# Patient Record
Sex: Female | Born: 1985
Health system: Southern US, Community
[De-identification: ages and names within clinical notes are randomized; demographics above are authoritative.]

## PROBLEM LIST (undated history)

## (undated) DIAGNOSIS — I499 Cardiac arrhythmia, unspecified: Secondary | ICD-10-CM

## (undated) DIAGNOSIS — I83891 Varicose veins of right lower extremities with other complications: Secondary | ICD-10-CM

## (undated) HISTORY — DX: Varicose veins of right lower extremity with other complications: I83.891

## (undated) HISTORY — DX: Cardiac arrhythmia, unspecified: I49.9

## (undated) HISTORY — PX: HAND SURGERY: SHX662

---
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
## 2013-01-12 ENCOUNTER — Encounter: Payer: Self-pay | Admitting: Vascular Surgery

## 2013-01-13 ENCOUNTER — Ambulatory Visit (INDEPENDENT_AMBULATORY_CARE_PROVIDER_SITE_OTHER): Payer: PRIVATE HEALTH INSURANCE | Admitting: Vascular Surgery

## 2013-01-13 ENCOUNTER — Encounter: Payer: Self-pay | Admitting: Vascular Surgery

## 2013-01-13 ENCOUNTER — Encounter (INDEPENDENT_AMBULATORY_CARE_PROVIDER_SITE_OTHER): Payer: PRIVATE HEALTH INSURANCE | Admitting: Vascular Surgery

## 2013-01-13 VITALS — BP 93/59 | HR 71 | Resp 16 | Ht 63.5 in | Wt 110.0 lb

## 2013-01-13 DIAGNOSIS — M7989 Other specified soft tissue disorders: Secondary | ICD-10-CM

## 2013-01-13 DIAGNOSIS — I83893 Varicose veins of bilateral lower extremities with other complications: Secondary | ICD-10-CM

## 2013-01-13 DIAGNOSIS — I83891 Varicose veins of right lower extremities with other complications: Secondary | ICD-10-CM | POA: Insufficient documentation

## 2013-01-13 DIAGNOSIS — M79609 Pain in unspecified limb: Secondary | ICD-10-CM

## 2013-01-13 NOTE — Assessment & Plan Note (Signed)
This patient has a cluster of varicose veins along the medial aspect of her right lower extremity. This is likely fed by an incompetent segment of the greater saphenous vein around the knee. The saphenous vein in this area that was quite small and therefore she is not a good candidate at this point for laser ablation of this segment of her greater saphenous vein. Her symptoms only occur generally when she is running. We have discussed the use of compression stockings when she is running and I have written her a prescription for knee-high stockings with a gradient of 20-30 mm of mercury. I have also recommended that she elevate her legs after running. If her symptoms progressed and she could be considered for surgical excision of the incompetent segment of her greater saphenous vein with stab avulsion of her large truncal varicosities in this area. If her symptoms worsen she knows to call us and we'll be happy to see her back at any time.

## 2013-01-13 NOTE — Progress Notes (Signed)
[facility]  Patient name: Deborah Stafford MRN: [id number] DOB: 13-Dec-1985 Sex: female  REASON FOR CONSULT: painful varicose veins of the right lower extremity.  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 Since that time she developed varicose veins in the medial aspect of her right leg. He said gradually progressed. She really does not experience any significant symptoms in her leg with standing and sitting. However, when she runs she does develop significant aching pain and swelling in the right leg. This is relieved with rest. She is unaware of any previous history of DVT or phlebitis. There is no family history of varicose veins that she is aware of.  Past Medical History  Diagnosis Date  . Irregular heartbeat     Family History  Problem Relation Age of Onset  . Hypertension Mother   . Hyperlipidemia Father     under control  . Cancer Maternal Grandfather     Lung  . Diabetes Maternal Grandfather     Type II  . Cancer Paternal Grandfather     Lung   SOCIAL HISTORY: History  Substance Use Topics  . Smoking status: Never Smoker   . Smokeless tobacco: Never Used  . Alcohol Use: 0.6 oz/week    1 Cans of beer per week   Current Outpatient Prescriptions  Medication Sig Dispense Refill  . loratadine (CLARITIN) 10 MG tablet Take 10 mg by mouth continuous as needed for allergies.      . Multiple Vitamin (MULTIVITAMIN) tablet Take 1 tablet by mouth daily.       No current facility-administered medications for this visit.   REVIEW OF SYSTEMS: Arly.Keller ] denotes positive finding; [  ] denotes negative finding  CARDIOVASCULAR:  [ ]  chest pain   [ ]  chest pressure   [ ]  palpitations   [ ]  orthopnea   [ ]  dyspnea on exertion   [ ]  claudication   [ ]  rest pain   [ ]  DVT   [ ]  phlebitis PULMONARY:   [ ]  productive cough   [ ]  asthma   [ ]  wheezing NEUROLOGIC:   [ ]  weakness  [ ]  paresthesias  [ ]  aphasia  [ ]  amaurosis  [ ]   dizziness HEMATOLOGIC:   [ ]  bleeding problems   [ ]  clotting disorders MUSCULOSKELETAL:  [ ]  joint pain   [ ]  joint swelling [ ]  leg swelling GASTROINTESTINAL: [ ]   blood in stool  [ ]   hematemesis GENITOURINARY:  [ ]   dysuria  [ ]   hematuria PSYCHIATRIC:  [ ]  history of major depression INTEGUMENTARY:  [ ]  rashes  [ ]  ulcers CONSTITUTIONAL:  [ ]  fever   [ ]  chills  PHYSICAL EXAM: Filed Vitals:   01/13/13 1425  BP: 93/59  Pulse: 71  Resp: 16  Height: 5' 3.5" (1.613 m)  Weight: 110 lb (49.896 kg)  SpO2: 98%   Body mass index is 19.18 kg/(m^2). GENERAL: The patient is a well-nourished female, in no acute distress. The vital signs are documented above. CARDIOVASCULAR: There is a regular rate and rhythm. I do not detect carotid bruits. She has palpable pedal pulses. PULMONARY: There is good air exchange bilaterally without wheezing or rales. ABDOMEN: Soft and non-tender with normal pitched bowel sounds.  MUSCULOSKELETAL: There are no major deformities or cyanosis. NEUROLOGIC: No focal weakness or paresthesias are detected. SKIN: she has some large truncal varicosities along the medial aspect of her right  leg which are under elevated pressure. There is no hyperpigmentation. PSYCHIATRIC: The patient has a normal affect.  DATA:  I have independently interpreted her duplex scan of her right lower extremity which shows no evidence of DVT or deep vein reflux. She does have some reflux in the greater saphenous vein around the knee. Diameters here range from 0.32 to 0.39 cm.  MEDICAL ISSUES:  Varicose veins of lower extremities with other complications This patient has a cluster of varicose veins along the medial aspect of her right lower extremity. This is likely fed by an incompetent segment of the greater saphenous vein around the knee. The saphenous vein in this area that was quite small and therefore she is not a good candidate at this point for laser ablation of this segment of her  greater saphenous vein. Her symptoms only occur generally when she is running. We have discussed the use of compression stockings when she is running and I have written her a prescription for knee-high stockings with a gradient of 20-30 mm of mercury. I have also recommended that she elevate her legs after running. If her symptoms progressed and she could be considered for surgical excision of the incompetent segment of her greater saphenous vein with stab avulsion of her large truncal varicosities in this area. If her symptoms worsen she knows to call us and we'll be happy to see her back at any time.   Griffin Gerrard S [facility] [place] [facility] [place]: [phone]

## 2016-04-19 DIAGNOSIS — R41840 Attention and concentration deficit: Secondary | ICD-10-CM | POA: Diagnosis not present

## 2016-04-22 MED FILL — DEXTROAMP-AMP 7.5 MG TABLET: 7.5 | 30 days supply | Qty: 90 | Fill #0

## 2016-05-14 DIAGNOSIS — R41840 Attention and concentration deficit: Secondary | ICD-10-CM | POA: Diagnosis not present

## 2016-05-22 MED FILL — DEXTROAMP-AMPHETAM 7.5 MG T: 7.5 | 30 days supply | Qty: 90 | Fill #0

## 2016-06-20 MED FILL — DEXTROAMP-AMPHETAM 7.5 MG T: 7.5 | 30 days supply | Qty: 90 | Fill #0

## 2016-07-17 MED FILL — DEXTROAMP-AMPHETAM 7.5 MG T: 7.5 | 30 days supply | Qty: 90 | Fill #0

## 2016-07-25 DIAGNOSIS — N921 Excessive and frequent menstruation with irregular cycle: Secondary | ICD-10-CM | POA: Diagnosis not present

## 2016-07-25 DIAGNOSIS — Z681 Body mass index (BMI) 19 or less, adult: Secondary | ICD-10-CM | POA: Diagnosis not present

## 2016-07-25 DIAGNOSIS — Z0000 Encounter for general adult medical examination without abnormal findings: Secondary | ICD-10-CM | POA: Diagnosis not present

## 2016-07-25 DIAGNOSIS — R41840 Attention and concentration deficit: Secondary | ICD-10-CM | POA: Diagnosis not present

## 2016-07-25 DIAGNOSIS — J309 Allergic rhinitis, unspecified: Secondary | ICD-10-CM | POA: Diagnosis not present

## 2016-08-01 DIAGNOSIS — Z23 Encounter for immunization: Secondary | ICD-10-CM | POA: Diagnosis not present

## 2016-08-01 DIAGNOSIS — Z0000 Encounter for general adult medical examination without abnormal findings: Secondary | ICD-10-CM | POA: Diagnosis not present

## 2016-08-14 MED FILL — DEXTROAMP-AMPHETAM 7.5 MG T: 7.5 | 90 days supply | Qty: 270 | Fill #0

## 2016-08-27 MED FILL — LARIN 21 1-20 TABLET: 1-20 | 84 days supply | Qty: 63 | Fill #0

## 2016-11-11 MED FILL — LARIN 21 1-20 TABLET: 1-20 | 84 days supply | Qty: 63 | Fill #1

## 2016-11-12 DIAGNOSIS — J309 Allergic rhinitis, unspecified: Secondary | ICD-10-CM | POA: Diagnosis not present

## 2016-11-12 DIAGNOSIS — R41840 Attention and concentration deficit: Secondary | ICD-10-CM | POA: Diagnosis not present

## 2016-11-12 DIAGNOSIS — Z6821 Body mass index (BMI) 21.0-21.9, adult: Secondary | ICD-10-CM | POA: Diagnosis not present

## 2016-11-12 MED FILL — MONTELUKAST SOD 10 MG TAB: 10 | 90 days supply | Qty: 90 | Fill #0

## 2016-11-12 MED FILL — DEXTROAMP-AMPHETAM 7.5 MG T: 7.5 | 90 days supply | Qty: 270 | Fill #0

## 2017-01-23 MED FILL — LARIN 21 1-20 TABLET: 1-20 | 84 days supply | Qty: 63 | Fill #2

## 2017-02-11 DIAGNOSIS — J309 Allergic rhinitis, unspecified: Secondary | ICD-10-CM | POA: Diagnosis not present

## 2017-02-11 DIAGNOSIS — N921 Excessive and frequent menstruation with irregular cycle: Secondary | ICD-10-CM | POA: Diagnosis not present

## 2017-02-11 DIAGNOSIS — Z6821 Body mass index (BMI) 21.0-21.9, adult: Secondary | ICD-10-CM | POA: Diagnosis not present

## 2017-02-11 DIAGNOSIS — R41840 Attention and concentration deficit: Secondary | ICD-10-CM | POA: Diagnosis not present

## 2017-02-11 MED FILL — AMPHETAMINE SALTS 15 MG TAB: 15 | 90 days supply | Qty: 180 | Fill #0

## 2017-04-11 MED FILL — LARIN 21 1-20 TABLET: 1-20 | 78 days supply | Qty: 63 | Fill #0

## 2017-05-13 DIAGNOSIS — Z6822 Body mass index (BMI) 22.0-22.9, adult: Secondary | ICD-10-CM | POA: Diagnosis not present

## 2017-05-13 DIAGNOSIS — R41840 Attention and concentration deficit: Secondary | ICD-10-CM | POA: Diagnosis not present

## 2017-05-13 MED FILL — AMPHETAMINE SALTS 15 MG TAB: 15 | 90 days supply | Qty: 180 | Fill #0

## 2017-07-04 MED FILL — LARIN 21 1-20 TABLET: 1-20 | 78 days supply | Qty: 63 | Fill #1

## 2017-08-01 DIAGNOSIS — Z0000 Encounter for general adult medical examination without abnormal findings: Secondary | ICD-10-CM | POA: Diagnosis not present

## 2017-08-06 DIAGNOSIS — Z6822 Body mass index (BMI) 22.0-22.9, adult: Secondary | ICD-10-CM | POA: Diagnosis not present

## 2017-08-06 DIAGNOSIS — R41840 Attention and concentration deficit: Secondary | ICD-10-CM | POA: Diagnosis not present

## 2017-08-06 DIAGNOSIS — L509 Urticaria, unspecified: Secondary | ICD-10-CM | POA: Diagnosis not present

## 2017-08-06 DIAGNOSIS — Z01419 Encounter for gynecological examination (general) (routine) without abnormal findings: Secondary | ICD-10-CM | POA: Diagnosis not present

## 2017-08-06 DIAGNOSIS — Z118 Encounter for screening for other infectious and parasitic diseases: Secondary | ICD-10-CM | POA: Diagnosis not present

## 2017-08-06 DIAGNOSIS — L812 Freckles: Secondary | ICD-10-CM | POA: Diagnosis not present

## 2017-08-06 DIAGNOSIS — N760 Acute vaginitis: Secondary | ICD-10-CM | POA: Diagnosis not present

## 2017-08-06 DIAGNOSIS — Z0000 Encounter for general adult medical examination without abnormal findings: Secondary | ICD-10-CM | POA: Diagnosis not present

## 2017-08-06 DIAGNOSIS — I83811 Varicose veins of right lower extremities with pain: Secondary | ICD-10-CM | POA: Diagnosis not present

## 2017-08-11 MED FILL — DEXTROAMP-AMPHETAMIN 15 MG: 15 | 90 days supply | Qty: 180 | Fill #0

## 2017-09-09 ENCOUNTER — Encounter: Payer: PRIVATE HEALTH INSURANCE | Admitting: Vascular Surgery

## 2017-09-22 ENCOUNTER — Ambulatory Visit (INDEPENDENT_AMBULATORY_CARE_PROVIDER_SITE_OTHER): Payer: PRIVATE HEALTH INSURANCE | Admitting: Vascular Surgery

## 2017-09-22 ENCOUNTER — Encounter: Payer: Self-pay | Admitting: Vascular Surgery

## 2017-09-22 VITALS — BP 125/81 | HR 96 | Temp 97.1°F | Resp 18 | Ht 64.0 in | Wt 125.6 lb

## 2017-09-22 DIAGNOSIS — I83891 Varicose veins of right lower extremities with other complications: Secondary | ICD-10-CM

## 2017-09-22 DIAGNOSIS — I868 Varicose veins of other specified sites: Secondary | ICD-10-CM

## 2017-09-22 NOTE — Progress Notes (Signed)
Subjective:     Patient ID: Deborah Stafford, female   DOB: 09/15/1985, 32 y.o.   MRN: [id number]  HPI This 32 year old female returns for further evaluation of her painful varicosities in the right leg. She was seen by Dr. Cari Carawayhris Dickson in 2014 for bulging varicosities in the right lower leg emanating from the great saphenous vein. At that time she did not have severe symptoms other than running and she has had conservative treatment since that time. She now has had increasing symptoms and increasing Lee prominent bulges in the right lower leg. She had some bulging varicosities in high school and has received some trauma to this area on a couple of occasions which have resulted in worsening of the symptoms and the prominence of varicosities. She has no history of DVT thrombophlebitis stasis ulcers or bleeding. She does develop some swelling as the day progresses. She tried long-leg elastic compression stockings on occasion which do not help her symptoms. This is affecting her daily living.  Past Medical History:  Diagnosis Date  . Irregular heartbeat     Social History   Tobacco Use  . Smoking status: Never Smoker  . Smokeless tobacco: Never Used  Substance Use Topics  . Alcohol use: Yes    Alcohol/week: 0.6 oz    Types: 1 Cans of beer per week    Family History  Problem Relation Age of Onset  . Hypertension Mother   . Hyperlipidemia Father        under control  . Cancer Maternal Grandfather        Lung  . Diabetes Maternal Grandfather        Type II  . Cancer Paternal Grandfather        Lung    No Known Allergies   Current Outpatient Medications:  .  amphetamine-dextroamphetamine (ADDERALL) 15 MG tablet, Take 15 mg by mouth 2 (two) times daily., Disp: , Rfl:  .  loratadine (CLARITIN) 10 MG tablet, Take 10 mg by mouth continuous as needed for allergies., Disp: , Rfl:  .  Multiple Vitamin (MULTIVITAMIN) tablet, Take 1 tablet by mouth daily., Disp: , Rfl:  .   norethindrone-ethinyl estradiol (MICROGESTIN,JUNEL,LOESTRIN) 1-20 MG-MCG tablet, Take 1 tablet by mouth daily., Disp: , Rfl:   Vitals:   09/22/17 0859  BP: 125/81  Pulse: 96  Resp: 18  Temp: (!) 97.1 F (36.2 C)  TempSrc: Oral  SpO2: 100%  Weight: 125 lb 9.6 oz (57 kg)  Height: 5\' 4"  (1.626 m)    Body mass index is 21.56 kg/m.         Review of Systems Denies chest pain, dyspnea on exertion, PND, orthopnea, hemoptysis, claudication    Objective:   Physical Exam BP 125/81 (BP Location: Left Arm, Patient Position: Sitting, Cuff Size: Normal)   Pulse 96   Temp (!) 97.1 F (36.2 C) (Oral)   Resp 18   Ht 5\' 4"  (1.626 m)   Wt 125 lb 9.6 oz (57 kg)   SpO2 100%   BMI 21.56 kg/m     Gen.-alert and oriented x3 in no apparent distress HEENT normal for age Lungs no rhonchi or wheezing Cardiovascular regular rhythm no murmurs carotid pulses 3+ palpable no bruits audible Abdomen soft nontender no palpable masses Musculoskeletal free of  major deformities Skin clear -no rashes Neurologic normal Lower extremities 3+ femoral and dorsalis pedis pulses palpable bilaterally with no edema Right leg with large prominent varicosities beginning just below the knee off the great saphenous  system extending into the pretibial area anterolaterally and down towards the ankle. She has no hyperpigmentation or ulceration noted. No varicosities and contralateral left leg.  Today I reviewed the previous ultrasound performed in 2014 and also performed a bedside SonoSite ultrasound exam. With the patient standing she has a fairly large right great saphenous vein in the distal thigh extending below the knee supplying these painful varicosities and there is reflux. Small saphenous vein appears normal       Assessment:     Painful varicosities right leg with gross reflux in the distal great saphenous vein with enlargement supplying these painful varicosities. The symptoms are affecting her daily  living.    Plan:         #1 long leg elastic compression stockings 20-30 mm gradient #2 elevate legs as much as possible #3 ibuprofen daily on a regular basis for pain #4 return in 3 months-she will have formal venous reflux exam of the right leg upon return in all like formal recommendation at that time. She should have these treated since they are becoming increasingly prominent and symptomatic.

## 2017-09-23 NOTE — Addendum Note (Signed)
Addended by: Burton ApleyPETTY, Yuleni Burich A on: 09/23/2017 03:15 PM   Modules accepted: Orders

## 2017-09-26 MED FILL — LARIN 21 1-20 TABLET: 1-20 | 78 days supply | Qty: 63 | Fill #2

## 2017-10-27 ENCOUNTER — Encounter: Payer: PRIVATE HEALTH INSURANCE | Admitting: Vascular Surgery

## 2017-11-05 DIAGNOSIS — Z6821 Body mass index (BMI) 21.0-21.9, adult: Secondary | ICD-10-CM | POA: Diagnosis not present

## 2017-11-05 DIAGNOSIS — R41840 Attention and concentration deficit: Secondary | ICD-10-CM | POA: Diagnosis not present

## 2017-11-05 DIAGNOSIS — Z3041 Encounter for surveillance of contraceptive pills: Secondary | ICD-10-CM | POA: Diagnosis not present

## 2017-11-05 DIAGNOSIS — I83811 Varicose veins of right lower extremities with pain: Secondary | ICD-10-CM | POA: Diagnosis not present

## 2017-11-07 MED FILL — DEXTROAMP-AMPHETAMIN 15 MG: 15 | 90 days supply | Qty: 180 | Fill #0

## 2017-12-11 ENCOUNTER — Telehealth: Payer: 59 | Admitting: Family

## 2017-12-11 DIAGNOSIS — R21 Rash and other nonspecific skin eruption: Secondary | ICD-10-CM | POA: Diagnosis not present

## 2017-12-11 MED ORDER — CLINDAMYCIN PHOS-BENZOYL PEROX 1-5 % EX GEL: Freq: Two times a day (BID) | CUTANEOUS | 0 refills | Status: DC

## 2017-12-11 MED ORDER — PREDNISONE 5 MG PO TABS: 5.0000 mg | ORAL_TABLET | ORAL | 0 refills | Status: DC

## 2017-12-11 MED FILL — predniSONE 5 MG TABS: 5 | 6 days supply | Qty: 21 | Fill #0

## 2017-12-11 MED FILL — CLINDAMYCIN PHOS-BENZOYL PE: 1-5 | 30 days supply | Qty: 25 | Fill #0

## 2017-12-11 NOTE — Progress Notes (Signed)
Thank you for the details you included in the comment boxes. Those details are very helpful in determining the best course of treatment for you and help [place] to provide the best care. Honestly, this does not look like scabies. However, it is very difficult to tell what it is because there is no bite pattern with certain bugs that we know (like bed bugs, etc.). The spots are scattered without a specific pattern. It is likely acne (which can be a reaction to many things internally coming out), or an allergic reaction. If it is an allergic reaction, we treat it with prednisone. However, if it is actually an acne breakout, the prednisone can make it much worse. Here is the ideal plan: 1 I am sending benzyl peroxide gel with antibiotics to see if it improves in 24-48 hours. If it does not, take the prednisone pack I am sending you.    We are sorry that you are experiencing this issue.  Here is how we plan to help!  Based on what you shared with me it looks like you have uncomplicated acne.  Acne is a disorder of the hair follicles and oil glands (sebaceous glands). The sebaceous glands secrete oils to keep the skin moist.  When the glands get clogged, it can lead to pimples or cysts.  These cysts may become infected and leave scars. Acne is very common and normally occurs at puberty.  Acne is also inherited.  Your personal care plan consists of the following recommendations:  I recommend that you use a daily cleanser  You might try an over the counter cleanser that has benzoyl peroxide.  I recommend that you start with a product that has 2.5% benzoyl peroxide.  Stronger concentrations have not been shown to be more effective.  I have prescribed a topical gel with an antibiotic:  Clindamycin-benzoyl peroxide gel.  This gel should be applied to the affected areas twice a day.  Be sure to read the package insert to understand potential side effects.  I have also prescribed one of the following additional  therapies:    If excessive dryness or peeling occurs, reduce dose frequency or concentration of the topical scrubs.  If excessive stinging or burning occurs, remove the topical gel with mild soap and water and resume at a lower dose the next day.  Remember oral antibiotics and topical acne treatments may increase your sensitivity to the sun!  HOME CARE:  Do not squeeze pimples because that can often lead to infections, worse acne, and scars.  Use a moisturizer that contains retinoid or fruit acids that may inhibit the development of new acne lesions.  Although there is not a clear link that foods can cause acne, doctors do believe that too many sweets predispose you to skin problems.  GET HELP RIGHT AWAY IF:  If your acne gets worse or is not better within 10 days.  If you become depressed.  If you become pregnant, discontinue medications and call your OB/GYN.  MAKE SURE YOU:  Understand these instructions.  Will watch your condition.  Will get help right away if you are not doing well or get worse.   Your e-visit answers were reviewed by a board certified advanced clinical practitioner to complete your personal care plan.  Depending upon the condition, your plan could have included both over the counter or prescription medications.  Please review your pharmacy choice.  If there is a problem, you may contact your provider through [employer] and have  the prescription routed to another pharmacy.  Your safety is important to us.  If you have drug allergies check your prescription carefully.  For the next 24 hours you can use MyChart to ask questions about today's visit, request a non-urgent call back, or ask for a work or school excuse from your e-visit provider.  You will get an email in the next two days asking about your experience. I hope that your e-visit has been valuable and will speed your recovery.

## 2017-12-15 MED FILL — LARIN 21 1-20 TABLET: 1-20 | 78 days supply | Qty: 63 | Fill #3

## 2017-12-23 ENCOUNTER — Ambulatory Visit (HOSPITAL_COMMUNITY)
Admission: RE | Admit: 2017-12-23 | Discharge: 2017-12-23 | Disposition: A | Discharge status: Home / Self Care | Payer: 59 | Source: Ambulatory Visit | Attending: Vascular Surgery | Admitting: Vascular Surgery

## 2017-12-23 ENCOUNTER — Encounter: Payer: Self-pay | Admitting: Vascular Surgery

## 2017-12-23 ENCOUNTER — Ambulatory Visit (INDEPENDENT_AMBULATORY_CARE_PROVIDER_SITE_OTHER): Payer: 59 | Admitting: Vascular Surgery

## 2017-12-23 VITALS — BP 104/67 | HR 86 | Temp 97.8°F | Resp 18 | Ht 63.0 in | Wt 124.8 lb

## 2017-12-23 DIAGNOSIS — R936 Abnormal findings on diagnostic imaging of limbs: Secondary | ICD-10-CM | POA: Diagnosis not present

## 2017-12-23 DIAGNOSIS — I83891 Varicose veins of right lower extremities with other complications: Secondary | ICD-10-CM | POA: Insufficient documentation

## 2017-12-23 DIAGNOSIS — I872 Venous insufficiency (chronic) (peripheral): Secondary | ICD-10-CM | POA: Diagnosis not present

## 2017-12-23 NOTE — Progress Notes (Signed)
Subjective:     Patient ID: Deborah Stafford, female   DOB: 03-17-1986, 32 y.o.   MRN: [id number]  HPI This 32 year old female returns for further follow-up regarding her painful varicosities in the right calf.  She has tried long-leg elastic compression stockings 20-30 mm gradient as well as elevation and ibuprofen but continues to have a heavy achy throb E feeling which worsens as the day progresses.  She will also develop some slight swelling in the right ankle.  She has no history of DVT or thrombophlebitis.  This is affecting her daily living and ability to work and she would like treatment.  Past Medical History:  Diagnosis Date  . Irregular heartbeat     Social History   Tobacco Use  . Smoking status: Never Smoker  . Smokeless tobacco: Never Used  Substance Use Topics  . Alcohol use: Yes    Alcohol/week: 0.6 oz    Types: 1 Cans of beer per week    Family History  Problem Relation Age of Onset  . Hypertension Mother   . Hyperlipidemia Father        under control  . Cancer Maternal Grandfather        Lung  . Diabetes Maternal Grandfather        Type II  . Cancer Paternal Grandfather        Lung    No Known Allergies   Current Outpatient Medications:  .  amphetamine-dextroamphetamine (ADDERALL) 15 MG tablet, Take 15 mg by mouth 2 (two) times daily., Disp: , Rfl:  .  clindamycin-benzoyl peroxide (BENZACLIN) gel, Apply topically 2 (two) times daily. To affected areas, Disp: 25 g, Rfl: 0 .  loratadine (CLARITIN) 10 MG tablet, Take 10 mg by mouth continuous as needed for allergies., Disp: , Rfl:  .  Multiple Vitamin (MULTIVITAMIN) tablet, Take 1 tablet by mouth daily., Disp: , Rfl:  .  norethindrone-ethinyl estradiol (MICROGESTIN,JUNEL,LOESTRIN) 1-20 MG-MCG tablet, Take 1 tablet by mouth daily., Disp: , Rfl:  .  predniSONE (DELTASONE) 5 MG tablet, Take 1 tablet (5 mg total) by mouth as directed. Taper 6,5,4,3,2,1 (Patient not taking: Reported on 12/23/2017), Disp: 21  tablet, Rfl: 0  Vitals:   12/23/17 1303  BP: 104/67  Pulse: 86  Resp: 18  Temp: 97.8 F (36.6 C)  TempSrc: Oral  SpO2: 100%  Weight: 124 lb 12.8 oz (56.6 kg)  Height:  (1.6 m)    Body mass index is 22.11 kg/m.         Review of Systems Denies chest pain, dyspnea on exertion, PND, orthopnea, hemoptysis    Objective:   Physical Exam BP 104/67 (BP Location: Left Arm, Patient Position: Sitting, Cuff Size: Normal)   Pulse 86   Temp 97.8 F (36.6 C) (Oral)   Resp 18   Ht  (1.6 m)   Wt 124 lb 12.8 oz (56.6 kg)   SpO2 100%   BMI 22.11 kg/m   Well-developed well-nourished female no apparent distress alert and oriented x3 Right leg with bulging varicosities beginning in the medial calf just below the knee extending into the pretibial region and lateral calf and down toward the lateral malleolus.  No hyperpigmentation or ulceration noted.  3+ dorsalis pedis pulse palpable.  Trace edema present.  Today I ordered a venous duplex exam of the right leg which I reviewed and interpreted and compared this with the bedside SonoSite ultrasound exam.  Patient does have gross  reflux and an enlarged right great saphenous vein  particularly in the mid and distal thigh and proximal calf which is supplying these painful varicosities .     Assessment:     Painful varicosities right leg due to gross reflux right great saphenous vein causing symptoms which are affecting patient's daily living and resistant to conservative measures including long-leg elastic compression stockings 20-30 mm gradient, elevation, and ibuprofen.  Patient does desire treatment.    Plan:     Patient needs laser ablation right great saphenous vein plus greater than 20 stab phlebectomy of painful varicosities to be performed as a single procedure We will proceed with precertification to perform this in the near future and relieve this patient's symptoms

## 2018-01-07 ENCOUNTER — Telehealth: Payer: Self-pay | Admitting: Vascular Surgery

## 2018-01-07 ENCOUNTER — Other Ambulatory Visit: Payer: Self-pay | Admitting: *Deleted

## 2018-01-07 DIAGNOSIS — I83891 Varicose veins of right lower extremities with other complications: Secondary | ICD-10-CM

## 2018-01-07 NOTE — Telephone Encounter (Signed)
sch appt 01/20/18 3pm post LA duplex 345 pm F/U MD spk to pt about appt

## 2018-01-07 NOTE — Telephone Encounter (Signed)
-----   Message from Micah Flesher, RN sent at 01/07/2018  1:16 PM EDT ----- Regarding: RE: scheduling Looks like the 10:45 am and 1:45 PM post LA VV pts. Have already been scheduled.  Think we were read the schedule incorrectly.  Looks like there is a 3:45 PM 1 week post LA slot for Dr. Hart Rochester and there should be a 3:00PM post LA duplex slot open for this patient.  Let me know if this works.    Thanks, Lamar Laundry  ----- Message ----- From: Magdalene River Sent: 01/07/2018  11:14 AM To: Micah Flesher, RN Subject: RE: scheduling                                 Can we use the 10am laser slot for a f/u or will it be used for another limited laser ----- Message ----- From: Micah Flesher, RN Sent: 01/07/2018  10:08 AM To: Donita Brooks Admin Pool Subject: scheduling                                     Please schedule Thelma Barge for post LA duplex (right leg, order in Epic) and VV FU with Dr. Hart Rochester on 01-20-2018.  (VVS closed on 01-19-2018) Thanks!!!!!!!

## 2018-01-13 ENCOUNTER — Encounter: Payer: Self-pay | Admitting: Vascular Surgery

## 2018-01-13 ENCOUNTER — Ambulatory Visit (INDEPENDENT_AMBULATORY_CARE_PROVIDER_SITE_OTHER): Payer: 59 | Admitting: Vascular Surgery

## 2018-01-13 VITALS — BP 96/55 | HR 80 | Temp 97.0°F | Resp 16 | Ht 63.5 in | Wt 132.0 lb

## 2018-01-13 DIAGNOSIS — I83891 Varicose veins of right lower extremities with other complications: Secondary | ICD-10-CM

## 2018-01-13 HISTORY — PX: ENDOVENOUS ABLATION SAPHENOUS VEIN W/ LASER: SUR449

## 2018-01-13 NOTE — Progress Notes (Signed)
Laser Ablation Procedure    Date: 01/13/2018   Deborah Stafford DOB:[birth year]  Consent signed: Yes    Surgeon:  Dr. Quita Skye. Hart Rochester  Procedure: Laser Ablation: right Greater Saphenous Vein  BP (!) 96/55 (BP Location: Left Arm, Patient Position: Sitting, Cuff Size: Normal)   Pulse 80   Temp (!) 97 F (36.1 C) (Oral)   Resp 16   Ht 5' 3.5" (1.613 m)   Wt 132 lb (59.9 kg)   SpO2 98%   BMI 23.02 kg/m   Tumescent Anesthesia: 500 cc 0.9% NaCl with 50 cc Lidocaine HCL 1% and 15 cc 8.4% NaHCO3  Local Anesthesia: 9 cc Lidocaine HCL and NaHCO3 (ratio 2:1)  Pulsed Mode: 15 watts, delay, 1.0 duration  Total Energy: 2544 Joules             Total Pulses:  170              Total Time: 2:50    Stab Phlebectomy: >20 Sites: Calf  Patient tolerated procedure well    Description of Procedure:  After marking the course of the secondary varicosities, the patient was placed on the operating table in the supine position, and the right leg was prepped and draped in sterile fashion.   Local anesthetic was administered and under ultrasound guidance the saphenous vein was accessed with a micro needle and guide wire; then the mirco puncture sheath was placed.  A guide wire was inserted saphenofemoral junction , followed by a 5 french sheath.  The position of the sheath and then the laser fiber below the junction was confirmed using the ultrasound.  Tumescent anesthesia was administered along the course of the saphenous vein using ultrasound guidance. The patient was placed in Trendelenburg position and protective laser glasses were placed on patient and staff, and the laser was fired at 15 watts continuous mode advancing 1-67mm/second for a total of 2544 joules.   For stab phlebectomies, local anesthetic was administered at the previously marked varicosities, and tumescent anesthesia was administered around the vessels.  Greater than 20 stab wounds were made using the tip of an 11 blade. And using  the vein hook, the phlebectomies were performed using a hemostat to avulse the varicosities.  Adequate hemostasis was achieved.     Steri strips were applied to the stab wounds and ABD pads and thigh high compression stockings were applied.  Ace wrap bandages were applied over the phlebectomy sites and at the top of the saphenofemoral junction. Blood loss was less than 15 cc.  The patient ambulated out of the operating room having tolerated the procedure well.

## 2018-01-13 NOTE — Progress Notes (Signed)
  Subjective:     Patient ID: Deborah Stafford, female   DOB: 27-Jul-1986, 33 y.o.   MRN: [id number]  HPI This 32 year old female had laser ablation of the right great saphenous vein from the proximal calf to near the saphenofemoral junction plus greater than 20 stab phlebectomy painful varicosities performed under local tumescent anesthesia. A total of 2544 J of energy was utilized. She tolerated the procedures well.  Review of Systems     Objective:   Physical Exam BP (!) 96/55 (BP Location: Left Arm, Patient Position: Sitting, Cuff Size: Normal)   Pulse 80   Temp (!) 97 F (36.1 C) (Oral)   Resp 16   Ht 5' 3.5" (1.613 m)   Wt 132 lb (59.9 kg)   SpO2 98%   BMI 23.02 kg/m        Assessment:     Well-tolerated laser ablation right great saphenous vein with multiple stab phlebectomy of painful varicosities performed under local tumescent anesthesia    Plan:     Return in 1 week for venous duplex exam to confirm closure right great saphenous vein

## 2018-01-14 ENCOUNTER — Encounter: Payer: Self-pay | Admitting: Vascular Surgery

## 2018-01-17 ENCOUNTER — Telehealth: Payer: 59 | Admitting: Nurse Practitioner

## 2018-01-17 DIAGNOSIS — R21 Rash and other nonspecific skin eruption: Secondary | ICD-10-CM

## 2018-01-17 NOTE — Progress Notes (Signed)
Based on what you shared with me it looks like you have a serious condition that should be evaluated in a face to face office visit.  NOTE: If you entered your credit card information for this eVisit, you will not be charged. You may see a "hold" on your card for the $30 but that hold will drop off and you will not have a charge processed.  If you are having a true medical emergency please call 911.  If you need an urgent face to face visit, [facility] has four urgent care centers for your convenience.  If you need care fast and have a high deductible or no insurance consider:   [email or web address].instacarecheckin.com/ to reserve your spot online an avoid wait times  [facility] [address], [address] [place], [place] [address] 8 am to 8 pm Monday-Friday 10 am to 4 pm Saturday-Sunday *Across the street from [employer]  [facility]  [address]  [place], [address] 8 am to 5 pm Monday-Friday * In the [facility]   The following sites will take your  insurance:  . [facility]  [phone] Get Driving Directions Find a Provider at this Location  [address] [place], [place] [address] . 10 am to 8 pm Monday-Friday . 12 pm to 8 pm Saturday-Sunday   . [facility]  [phone] Get Driving Directions Find a Provider at this Location  [address], [address] [place], [place] [address] . 8 am to 8 pm Monday-Friday . 9 am to 6 pm Saturday . 11 am to 6 pm Sunday   . [facility] [place]  [phone] Get Driving Directions  [address] [place],  [address] . 8 am to 8 pm Monday-Friday . 8 am to 4 pm Saturday-Sunday   Your e-visit answers were reviewed by a board certified advanced clinical practitioner to complete your personal care plan.  Thank you for using e-Visits.  

## 2018-01-20 ENCOUNTER — Encounter (HOSPITAL_COMMUNITY): Payer: 59

## 2018-01-20 ENCOUNTER — Ambulatory Visit (HOSPITAL_COMMUNITY)
Admission: RE | Admit: 2018-01-20 | Discharge: 2018-01-20 | Disposition: A | Discharge status: Home / Self Care | Payer: 59 | Source: Ambulatory Visit | Attending: Vascular Surgery | Admitting: Vascular Surgery

## 2018-01-20 ENCOUNTER — Ambulatory Visit (INDEPENDENT_AMBULATORY_CARE_PROVIDER_SITE_OTHER): Payer: 59 | Admitting: Vascular Surgery

## 2018-01-20 ENCOUNTER — Encounter: Payer: Self-pay | Admitting: Vascular Surgery

## 2018-01-20 VITALS — BP 103/71 | HR 90 | Temp 97.7°F | Resp 16 | Ht 63.0 in | Wt 130.0 lb

## 2018-01-20 DIAGNOSIS — I83891 Varicose veins of right lower extremities with other complications: Secondary | ICD-10-CM | POA: Insufficient documentation

## 2018-01-20 NOTE — Progress Notes (Signed)
Subjective:     Patient ID: Deborah Stafford, female   DOB: August 25, 1986, 32 y.o.   MRN: [id number]  HPI This 32 year old female returns 1 week post-laser ablation right great saphenous vein with multiple stab phlebectomy of painful varicosities. She denies any discomfort at the stab phlebectomy sites. She had some mild-to-moderate discomfort in the thigh at the ablation site as one would expect. She's had no increasing edema or change in the lower third of the leg. She did take ibuprofen and where her elastic compression stockings as instructed.  Past Medical History:  Diagnosis Date  . Irregular heartbeat   . Varicose veins of leg with edema, right     Social History   Tobacco Use  . Smoking status: Never Smoker  . Smokeless tobacco: Never Used  Substance Use Topics  . Alcohol use: Yes    Alcohol/week: 0.6 oz    Types: 1 Cans of beer per week    Family History  Problem Relation Age of Onset  . Hypertension Mother   . Hyperlipidemia Father        under control  . Cancer Maternal Grandfather        Lung  . Diabetes Maternal Grandfather        Type II  . Cancer Paternal Grandfather        Lung    No Known Allergies   Current Outpatient Medications:  .  amphetamine-dextroamphetamine (ADDERALL) 15 MG tablet, Take 15 mg by mouth 2 (two) times daily., Disp: , Rfl:  .  clindamycin-benzoyl peroxide (BENZACLIN) gel, Apply topically 2 (two) times daily. To affected areas, Disp: 25 g, Rfl: 0 .  loratadine (CLARITIN) 10 MG tablet, Take 10 mg by mouth continuous as needed for allergies., Disp: , Rfl:  .  Multiple Vitamin (MULTIVITAMIN) tablet, Take 1 tablet by mouth daily., Disp: , Rfl:  .  norethindrone-ethinyl estradiol (MICROGESTIN,JUNEL,LOESTRIN) 1-20 MG-MCG tablet, Take 1 tablet by mouth daily., Disp: , Rfl:  .  predniSONE (DELTASONE) 5 MG tablet, Take 1 tablet (5 mg total) by mouth as directed. Taper 6,5,4,3,2,1 (Patient not taking: Reported on 01/20/2018), Disp: 21 tablet,  Rfl: 0  Vitals:   01/20/18 1537  BP: 103/71  Pulse: 90  Resp: 16  Temp: 97.7 F (36.5 C)  TempSrc: Oral  SpO2: 98%  Weight: 130 lb (59 kg)  Height:  (1.6 m)    Body mass index is 23.03 kg/m.         Review of Systems Denies chest pain, dyspnea on exertion, PND, orthopnea, hemoptysis    Objective:   Physical Exam BP 103/71 (BP Location: Left Arm, Patient Position: Sitting, Cuff Size: Normal)   Pulse 90   Temp 97.7 F (36.5 C) (Oral)   Resp 16   Ht  (1.6 m)   Wt 130 lb (59 kg)   SpO2 98%   BMI 23.03 kg/m   Gen. well-developed well-nourished female no apparent distress alert and oriented 3 Lungs no rhonchi or wheezing Right leg with mild discomfort to deep palpation over great saphenous vein. Stab phlebectomy sites healing appropriately. No distal edema noted.  Today I ordered a venous duplex exam the right leg which I reviewed and interpreted and also performed a bedside SonoSite ultrasound exam. There is no flow in the saphenous vein and it is closed up to near the saphenofemoral junction. I do not identify any thrombus in the common femoral vein and there is appropriate flow.     Assessment:  Successful laser ablation right great saphenous vein with multiple stab phlebectomy of painful varicosities    Plan:     Patient will return to see [place] on a when necessary basis

## 2018-02-10 DIAGNOSIS — R41840 Attention and concentration deficit: Secondary | ICD-10-CM | POA: Diagnosis not present

## 2018-02-10 DIAGNOSIS — Z6822 Body mass index (BMI) 22.0-22.9, adult: Secondary | ICD-10-CM | POA: Diagnosis not present

## 2018-02-10 MED FILL — DEXTROAMP-AMPHETAMIN 15 MG: 15 | 90 days supply | Qty: 180 | Fill #0

## 2018-03-10 MED FILL — LARIN 21 1-20 TABLET: 1-20 | 78 days supply | Qty: 63 | Fill #0

## 2018-03-13 DIAGNOSIS — Z6822 Body mass index (BMI) 22.0-22.9, adult: Secondary | ICD-10-CM | POA: Diagnosis not present

## 2018-03-13 DIAGNOSIS — L739 Follicular disorder, unspecified: Secondary | ICD-10-CM | POA: Diagnosis not present

## 2018-03-13 MED FILL — SULFAMETHOXAZOLE-TMP DS TAB: 800-160 | 10 days supply | Qty: 20 | Fill #0

## 2018-03-16 DIAGNOSIS — L509 Urticaria, unspecified: Secondary | ICD-10-CM | POA: Diagnosis not present

## 2018-03-16 DIAGNOSIS — T7840XA Allergy, unspecified, initial encounter: Secondary | ICD-10-CM | POA: Diagnosis not present

## 2018-03-16 DIAGNOSIS — Z6822 Body mass index (BMI) 22.0-22.9, adult: Secondary | ICD-10-CM | POA: Diagnosis not present

## 2018-03-16 DIAGNOSIS — L739 Follicular disorder, unspecified: Secondary | ICD-10-CM | POA: Diagnosis not present

## 2018-03-20 DIAGNOSIS — R21 Rash and other nonspecific skin eruption: Secondary | ICD-10-CM | POA: Diagnosis not present

## 2018-03-20 DIAGNOSIS — Z6822 Body mass index (BMI) 22.0-22.9, adult: Secondary | ICD-10-CM | POA: Diagnosis not present

## 2018-03-20 MED FILL — hydrOXYzine HCL 25 MG TABS: 25 | 10 days supply | Qty: 40 | Fill #0

## 2018-03-23 DIAGNOSIS — Z6822 Body mass index (BMI) 22.0-22.9, adult: Secondary | ICD-10-CM | POA: Diagnosis not present

## 2018-03-23 DIAGNOSIS — T50905D Adverse effect of unspecified drugs, medicaments and biological substances, subsequent encounter: Secondary | ICD-10-CM | POA: Diagnosis not present

## 2018-03-23 DIAGNOSIS — L270 Generalized skin eruption due to drugs and medicaments taken internally: Secondary | ICD-10-CM | POA: Diagnosis not present

## 2018-03-23 DIAGNOSIS — R21 Rash and other nonspecific skin eruption: Secondary | ICD-10-CM | POA: Diagnosis not present

## 2018-03-26 DIAGNOSIS — L309 Dermatitis, unspecified: Secondary | ICD-10-CM | POA: Diagnosis not present

## 2018-03-26 MED FILL — TRIAMCINOLONE 0.1% CREAM: 0.1 | 14 days supply | Qty: 15 | Fill #0

## 2018-05-04 MED FILL — hydrOXYzine HCL 25 MG TABS: 25 | 7 days supply | Qty: 27 | Fill #0

## 2018-05-12 DIAGNOSIS — Z6822 Body mass index (BMI) 22.0-22.9, adult: Secondary | ICD-10-CM | POA: Diagnosis not present

## 2018-05-12 DIAGNOSIS — L509 Urticaria, unspecified: Secondary | ICD-10-CM | POA: Diagnosis not present

## 2018-05-12 DIAGNOSIS — J309 Allergic rhinitis, unspecified: Secondary | ICD-10-CM | POA: Diagnosis not present

## 2018-05-12 DIAGNOSIS — R945 Abnormal results of liver function studies: Secondary | ICD-10-CM | POA: Diagnosis not present

## 2018-05-12 DIAGNOSIS — R21 Rash and other nonspecific skin eruption: Secondary | ICD-10-CM | POA: Diagnosis not present

## 2018-05-12 DIAGNOSIS — R41840 Attention and concentration deficit: Secondary | ICD-10-CM | POA: Diagnosis not present

## 2018-05-12 MED FILL — MONTELUKAST SOD 10 MG TAB: 10 | 90 days supply | Qty: 90 | Fill #0

## 2018-05-12 MED FILL — hydrOXYzine HCL 25 MG TABS: 25 | 15 days supply | Qty: 60 | Fill #0

## 2018-05-12 MED FILL — DEXTROAMP-AMPHETAMIN 15 MG: 15 | 90 days supply | Qty: 180 | Fill #0

## 2018-05-12 MED FILL — SM LORATADINE 10 MG TABS: 10 | 90 days supply | Qty: 90 | Fill #0

## 2018-05-18 DIAGNOSIS — Z6822 Body mass index (BMI) 22.0-22.9, adult: Secondary | ICD-10-CM | POA: Diagnosis not present

## 2018-05-18 DIAGNOSIS — T50905D Adverse effect of unspecified drugs, medicaments and biological substances, subsequent encounter: Secondary | ICD-10-CM | POA: Diagnosis not present

## 2018-05-18 DIAGNOSIS — L270 Generalized skin eruption due to drugs and medicaments taken internally: Secondary | ICD-10-CM | POA: Diagnosis not present

## 2018-05-18 DIAGNOSIS — L739 Follicular disorder, unspecified: Secondary | ICD-10-CM | POA: Diagnosis not present

## 2018-05-18 DIAGNOSIS — D6942 Congenital and hereditary thrombocytopenia purpura: Secondary | ICD-10-CM | POA: Diagnosis not present

## 2018-05-18 DIAGNOSIS — L509 Urticaria, unspecified: Secondary | ICD-10-CM | POA: Diagnosis not present

## 2018-06-04 MED FILL — LARIN 21 1-20 TABLET: 1-20 | 78 days supply | Qty: 63 | Fill #1

## 2018-08-10 DIAGNOSIS — R41840 Attention and concentration deficit: Secondary | ICD-10-CM | POA: Diagnosis not present

## 2018-08-10 DIAGNOSIS — Z6823 Body mass index (BMI) 23.0-23.9, adult: Secondary | ICD-10-CM | POA: Diagnosis not present

## 2018-08-10 MED FILL — DEXTROAMP-AMPHETAMIN 15 MG: 15 | 90 days supply | Qty: 180 | Fill #0

## 2018-09-16 MED FILL — LARIN 21 1-20 TABLET: 1-20 | 78 days supply | Qty: 63 | Fill #0

## 2018-09-25 MED FILL — IPRATROPIUM 0.06% SPRAY: 0.06 | 13 days supply | Qty: 15 | Fill #0

## 2018-09-25 MED FILL — BENZONATATE 200 MG CAPS: 200 | 11 days supply | Qty: 35 | Fill #0

## 2018-11-11 MED FILL — LARIN 21 1-20 TABLET: 1-20 | 72 days supply | Qty: 84 | Fill #0 | Status: TO

## 2018-11-12 MED FILL — DEXTROAMP-AMPHETAMIN 15 MG: 15 | 90 days supply | Qty: 180 | Fill #0

## 2019-02-10 MED FILL — DEXTROAMP-AMPHETAMIN 15 MG: 15 | 90 days supply | Qty: 180 | Fill #0

## 2019-03-22 MED FILL — LARIN 21 1-20 TABLET: 1-20 | 72 days supply | Qty: 84 | Fill #1 | Status: TO

## 2019-05-12 MED FILL — MONTELUKAST SOD 10 MG TAB: 10 | 90 days supply | Qty: 90 | Fill #0

## 2019-05-12 MED FILL — AMPHETAMINE-DEXTROAMPHETAMI: 15 | 90 days supply | Qty: 180 | Fill #0

## 2019-08-12 MED FILL — AZELASTINE HCL 137 MCG SPRY: 0.1 | 30 days supply | Qty: 30 | Fill #0

## 2019-11-08 ENCOUNTER — Encounter: Payer: Self-pay | Admitting: Emergency Medicine

## 2019-11-08 ENCOUNTER — Emergency Department
Admission: EM | Admit: 2019-11-08 | Discharge: 2019-11-08 | Disposition: A | Discharge status: Home / Self Care | Payer: 59 | Attending: Emergency Medicine | Admitting: Emergency Medicine

## 2019-11-08 ENCOUNTER — Encounter: Payer: Self-pay | Admitting: Vascular Surgery

## 2019-11-08 ENCOUNTER — Other Ambulatory Visit: Payer: Self-pay

## 2019-11-08 DIAGNOSIS — N390 Urinary tract infection, site not specified: Secondary | ICD-10-CM | POA: Diagnosis not present

## 2019-11-08 DIAGNOSIS — R1030 Lower abdominal pain, unspecified: Secondary | ICD-10-CM | POA: Diagnosis not present

## 2019-11-08 DIAGNOSIS — R319 Hematuria, unspecified: Secondary | ICD-10-CM | POA: Insufficient documentation

## 2019-11-08 DIAGNOSIS — R300 Dysuria: Secondary | ICD-10-CM | POA: Diagnosis present

## 2019-11-08 LAB — URINALYSIS, COMPLETE (UACMP) WITH MICROSCOPIC
Bilirubin Urine: NEGATIVE
Glucose, UA: NEGATIVE mg/dL
Ketones, ur: NEGATIVE mg/dL
Nitrite: NEGATIVE
Protein, ur: 100 mg/dL — AB
RBC / HPF: >50 RBC/hpf — ABNORMAL HIGH (ref 0–5)
Specific Gravity, Urine: 1.021 (ref 1.005–1.030)
WBC, UA: >50 WBC/hpf — ABNORMAL HIGH (ref 0–5)
pH: 6 (ref 5.0–8.0)

## 2019-11-08 LAB — POCT PREGNANCY, URINE: Preg Test, Ur: NEGATIVE

## 2019-11-08 MED ORDER — CEPHALEXIN 500 MG PO CAPS
500.0000 mg | ORAL_CAPSULE | Freq: Once | ORAL | Status: AC
  Administered 2019-11-08: 500 mg via ORAL
  Filled 2019-11-08: qty 1

## 2019-11-08 MED ORDER — CEPHALEXIN 500 MG PO CAPS: 500.0000 mg | ORAL_CAPSULE | Freq: Two times a day (BID) | ORAL | 0 refills | Status: AC

## 2019-11-08 MED ORDER — CEPHALEXIN 500 MG PO CAPS: 1000.0000 mg | ORAL_CAPSULE | Freq: Once | ORAL | Status: DC

## 2019-11-08 NOTE — ED Triage Notes (Signed)
Pt complains of hematuria and dysuria since 2000 tonight. Pt appears in no acute distress, pt denies fever, vomiting. Pt also complains of "pain where my bladder is".

## 2019-11-08 NOTE — ED Provider Notes (Signed)
[facility] Emergency Department Provider Note   ____________________________________________   First MD Initiated Contact with Patient 11/08/19 0448     (approximate)  I have reviewed the triage vital signs and the nursing notes.   HISTORY  Chief Complaint Dysuria and Hematuria    HPI Deborah Stafford is a 34 y.o. female with no significant past medical history who presents to the ED complaining of dysuria.  Patient reports that earlier this evening she started to notice burning when she urinates as well as suprapubic abdominal pain.  She is also concerned about noticing small streaks of blood in her urine.  She denies any fevers, flank pain, nausea, or vomiting.  She has not had any vaginal bleeding or discharge, does not think she could be pregnant.  She describes current symptoms as similar to prior UTI but more severe.        History reviewed. No pertinent past medical history.  There are no problems to display for this patient.   History reviewed. No pertinent surgical history.  Prior to Admission medications   Medication Sig Start Date End Date Taking? Authorizing Provider  cephALEXin (KEFLEX) 500 MG capsule Take 1 capsule (500 mg total) by mouth 2 (two) times daily for 7 days. 11/08/19 11/15/19  Blake Divine, MD    Allergies Sulfa antibiotics  No family history on file.  Social History Social History   Tobacco Use  . Smoking status: Not on file  Substance Use Topics  . Alcohol use: Not on file  . Drug use: Not on file    Review of Systems  Constitutional: No fever/chills Eyes: No visual changes. ENT: No sore throat. Cardiovascular: Denies chest pain. Respiratory: Denies shortness of breath. Gastrointestinal: Positive for abdominal pain.  No nausea, no vomiting.  No diarrhea.  No constipation. Genitourinary: Positive for dysuria. Musculoskeletal: Negative for back pain. Skin: Negative for rash. Neurological: Negative  for headaches, focal weakness or numbness.  ____________________________________________   PHYSICAL EXAM:  VITAL SIGNS: ED Triage Vitals  Enc Vitals Group     BP 11/08/19 0007 121/80     Pulse Rate 11/08/19 0007 80     Resp 11/08/19 0007 14     Temp 11/08/19 0007 98.1 F (36.7 C)     Temp Source 11/08/19 0007 Oral     SpO2 11/08/19 0007 100 %     Weight 11/08/19 0008 150 lb (68 kg)     Height 11/08/19 0008 5\' 4"  (1.626 m)     Head Circumference --      Peak Flow --      Pain Score 11/08/19 0008 8     Pain Loc --      Pain Edu? --      Excl. in [place]? --     Constitutional: Alert and oriented. Eyes: Conjunctivae are normal. Head: Atraumatic. Nose: No congestion/rhinnorhea. Mouth/Throat: Mucous membranes are moist. Neck: Normal ROM Cardiovascular: Normal rate, regular rhythm. Grossly normal heart sounds. Respiratory: Normal respiratory effort.  No retractions. Lungs CTAB. Gastrointestinal: Soft and mildly tender in the suprapubic area. No distention.  No CVA tenderness bilaterally. Genitourinary: deferred Musculoskeletal: No lower extremity tenderness nor edema. Neurologic:  Normal speech and language. No gross focal neurologic deficits are appreciated. Skin:  Skin is warm, dry and intact. No rash noted. Psychiatric: Mood and affect are normal. Speech and behavior are normal.  ____________________________________________   LABS (all labs ordered are listed, but only abnormal results are displayed)  Labs Reviewed  URINALYSIS,  COMPLETE (UACMP) WITH MICROSCOPIC - Abnormal; Notable for the following components:      Result Value   Color, Urine YELLOW (*)    APPearance HAZY (*)    Hgb urine dipstick LARGE (*)    Protein, ur 100 (*)    Leukocytes,Ua MODERATE (*)    RBC / HPF >50 (*)    WBC, UA >50 (*)    Bacteria, UA RARE (*)    All other components within normal limits  URINE CULTURE  POCT PREGNANCY, URINE  POC URINE PREG, ED    PROCEDURES  Procedure(s)  performed (including Critical Care):  Procedures   ____________________________________________   INITIAL IMPRESSION / ASSESSMENT AND PLAN / ED COURSE       34 year old female presents to the ED with approximately 12 hours of dysuria as well as hematuria and suprapubic abdominal pain.  She is overall well-appearing with reassuring vital signs.  Symptoms are consistent with UTI and UA is consistent with infection.  No evidence of pyelonephritis and I doubt nephrolithiasis.  We will plan to treat with Keflex and patient was counseled to return to the ED for new or worsening symptoms.  Patient agrees with plan.      ____________________________________________   FINAL CLINICAL IMPRESSION(S) / ED DIAGNOSES  Final diagnoses:  Lower urinary tract infectious disease     ED Discharge Orders         Ordered    cephALEXin (KEFLEX) 500 MG capsule  2 times daily     11/08/19 0503           Note:  This document was prepared using Dragon voice recognition software and may include unintentional dictation errors.   Chesley Noon, MD 11/08/19 [phone]

## 2019-11-10 LAB — URINE CULTURE
Culture: >=100000 — AB
Special Requests: NORMAL

## 2019-11-11 NOTE — Progress Notes (Signed)
ED Antimicrobial Stewardship Positive Culture Follow Up   Deborah Stafford is an 34 y.o. female who presented to [facility] on 11/08/2019 with a chief complaint of  Chief Complaint  Patient presents with  . Dysuria  . Hematuria    Recent Results (from the past 720 hour(s))  Urine Culture     Status: Abnormal   Collection Time: 11/08/19 12:11 AM   Specimen: Urine, Clean Catch  Result Value Ref Range Status   Specimen Description   Final    URINE, CLEAN CATCH Performed at [facility], [address], [place], [place] [address]    Special Requests   Final    Normal Performed at [facility], [address] [place], [place] [address]    Culture >=100,000 COLONIES/mL ENTEROBACTER AEROGENES (A)  Final   Report Status 11/10/2019 FINAL  Final   Organism ID, Bacteria ENTEROBACTER AEROGENES (A)  Final      Susceptibility   Enterobacter aerogenes - MIC*    CEFAZOLIN >=64 RESISTANT Resistant     CEFTRIAXONE <=0.25 SENSITIVE Sensitive     CIPROFLOXACIN <=0.25 SENSITIVE Sensitive     GENTAMICIN <=1 SENSITIVE Sensitive     IMIPENEM 1 SENSITIVE Sensitive     NITROFURANTOIN 64 INTERMEDIATE Intermediate     TRIMETH/SULFA <=20 SENSITIVE Sensitive     PIP/TAZO <=4 SENSITIVE Sensitive     * >=100,000 COLONIES/mL ENTEROBACTER AEROGENES    [x]  Treated with Cephalexin, organism resistant to prescribed antimicrobial  New antibiotic prescription: Cipro 250 mg PO BID x 3 days  ED Provider:  3/18 Called patient at [phone] to obtain preferred pharmacy. Patient would like to use [facility] on [phone]   [phone]. Prescription called in. Instructed patient to stop current antibiotic and start new antibiotic and avoid milk, dairy, calcium and iron within 2 hours of cipro.   Antonette Hendricks A 11/11/2019, 3:36 PM Clinical Pharmacist

## 2019-11-12 DIAGNOSIS — Z719 Counseling, unspecified: Secondary | ICD-10-CM | POA: Diagnosis not present

## 2019-11-12 DIAGNOSIS — R41840 Attention and concentration deficit: Secondary | ICD-10-CM | POA: Diagnosis not present

## 2019-11-12 DIAGNOSIS — N390 Urinary tract infection, site not specified: Secondary | ICD-10-CM | POA: Diagnosis not present

## 2019-11-12 DIAGNOSIS — D649 Anemia, unspecified: Secondary | ICD-10-CM | POA: Diagnosis not present

## 2020-01-28 ENCOUNTER — Encounter: Payer: Self-pay | Admitting: Anesthesiology

## 2020-01-28 ENCOUNTER — Emergency Department: Payer: BC Managed Care – PPO | Admitting: Anesthesiology

## 2020-01-28 ENCOUNTER — Other Ambulatory Visit: Payer: Self-pay

## 2020-01-28 ENCOUNTER — Encounter
Admission: EM | Disposition: A | Discharge status: Home / Self Care | Payer: Self-pay | Source: Home / Self Care | Attending: Emergency Medicine

## 2020-01-28 ENCOUNTER — Ambulatory Visit
Admission: EM | Admit: 2020-01-28 | Discharge: 2020-01-28 | Disposition: A | Discharge status: Home / Self Care | Payer: BC Managed Care – PPO | Attending: Emergency Medicine | Admitting: Emergency Medicine

## 2020-01-28 DIAGNOSIS — O034 Incomplete spontaneous abortion without complication: Secondary | ICD-10-CM | POA: Diagnosis not present

## 2020-01-28 DIAGNOSIS — N939 Abnormal uterine and vaginal bleeding, unspecified: Secondary | ICD-10-CM

## 2020-01-28 DIAGNOSIS — Z20822 Contact with and (suspected) exposure to covid-19: Secondary | ICD-10-CM | POA: Diagnosis not present

## 2020-01-28 HISTORY — PX: HYSTEROSCOPY WITH D & C: SHX1775

## 2020-01-28 LAB — BASIC METABOLIC PANEL
Anion gap: 9 (ref 5–15)
BUN: 14 mg/dL (ref 6–20)
CO2: 25 mmol/L (ref 22–32)
Calcium: 9.5 mg/dL (ref 8.9–10.3)
Chloride: 104 mmol/L (ref 98–111)
Creatinine, Ser: 0.5 mg/dL (ref 0.44–1.00)
GFR calc Af Amer: >60 mL/min (ref >60)
GFR calc non Af Amer: >60 mL/min (ref >60)
Glucose, Bld: 92 mg/dL (ref 70–99)
Potassium: 3.9 mmol/L (ref 3.5–5.1)
Sodium: 138 mmol/L (ref 135–145)

## 2020-01-28 LAB — CBC WITH DIFFERENTIAL/PLATELET
Abs Immature Granulocytes: 0.02 10*3/uL (ref 0.00–0.07)
Basophils Absolute: 0 10*3/uL (ref 0.0–0.1)
Basophils Relative: 0 %
Eosinophils Absolute: 0.1 10*3/uL (ref 0.0–0.5)
Eosinophils Relative: 1 %
HCT: 34.5 % — ABNORMAL LOW (ref 36.0–46.0)
Hemoglobin: 11.7 g/dL — ABNORMAL LOW (ref 12.0–15.0)
Immature Granulocytes: 0 %
Lymphocytes Relative: 34 %
Lymphs Abs: 3.1 10*3/uL (ref 0.7–4.0)
MCH: 30.1 pg (ref 26.0–34.0)
MCHC: 33.9 g/dL (ref 30.0–36.0)
MCV: 88.7 fL (ref 80.0–100.0)
Monocytes Absolute: 0.6 10*3/uL (ref 0.1–1.0)
Monocytes Relative: 6 %
Neutro Abs: 5.4 10*3/uL (ref 1.7–7.7)
Neutrophils Relative %: 59 %
Platelets: 187 10*3/uL (ref 150–400)
RBC: 3.89 MIL/uL (ref 3.87–5.11)
RDW: 11.9 % (ref 11.5–15.5)
WBC: 9.3 10*3/uL (ref 4.0–10.5)
nRBC: 0 % (ref 0.0–0.2)

## 2020-01-28 LAB — CBC
HCT: 36.4 % (ref 36.0–46.0)
Hemoglobin: 12.4 g/dL (ref 12.0–15.0)
MCH: 30.3 pg (ref 26.0–34.0)
MCHC: 34.1 g/dL (ref 30.0–36.0)
MCV: 89 fL (ref 80.0–100.0)
Platelets: 183 10*3/uL (ref 150–400)
RBC: 4.09 MIL/uL (ref 3.87–5.11)
RDW: 11.9 % (ref 11.5–15.5)
WBC: 8.8 10*3/uL (ref 4.0–10.5)
nRBC: 0 % (ref 0.0–0.2)

## 2020-01-28 LAB — PROTIME-INR
INR: 1 (ref 0.8–1.2)
Prothrombin Time: 12.6 seconds (ref 11.4–15.2)

## 2020-01-28 LAB — SARS CORONAVIRUS 2 BY RT PCR (HOSPITAL ORDER, PERFORMED IN ~~LOC~~ HOSPITAL LAB): SARS Coronavirus 2: NEGATIVE

## 2020-01-28 LAB — APTT: aPTT: 26 seconds (ref 24–36)

## 2020-01-28 LAB — HCG, QUANTITATIVE, PREGNANCY: hCG, Beta Chain, Quant, S: 13644 m[IU]/mL — ABNORMAL HIGH (ref <5)

## 2020-01-28 LAB — ABO/RH: ABO/RH(D): A POS

## 2020-01-28 LAB — TYPE AND SCREEN
ABO/RH(D): A POS
Antibody Screen: NEGATIVE

## 2020-01-28 SURGERY — DILATATION AND CURETTAGE /HYSTEROSCOPY
Anesthesia: General

## 2020-01-28 MED ORDER — EPHEDRINE SULFATE 50 MG/ML IJ SOLN
INTRAMUSCULAR | Status: DC | PRN
  Administered 2020-01-28 (×2): 10 mg via INTRAVENOUS

## 2020-01-28 MED ORDER — PROPOFOL 10 MG/ML IV BOLUS
INTRAVENOUS | Status: DC | PRN
  Administered 2020-01-28: 150 mg via INTRAVENOUS

## 2020-01-28 MED ORDER — ONDANSETRON HCL 4 MG/2ML IJ SOLN
INTRAMUSCULAR | Status: DC | PRN
  Administered 2020-01-28: 4 mg via INTRAVENOUS

## 2020-01-28 MED ORDER — DEXAMETHASONE SODIUM PHOSPHATE 10 MG/ML IJ SOLN
INTRAMUSCULAR | Status: AC
  Filled 2020-01-28: qty 1

## 2020-01-28 MED ORDER — LACTATED RINGERS IV SOLN: INTRAVENOUS | Status: DC | PRN

## 2020-01-28 MED ORDER — IBUPROFEN 800 MG PO TABS
800.0000 mg | ORAL_TABLET | Freq: Three times a day (TID) | ORAL | 1 refills | Status: DC | PRN
Start: 2020-01-28 — End: 2020-11-21

## 2020-01-28 MED ORDER — ONDANSETRON 4 MG PO TBDP
4.0000 mg | ORAL_TABLET | Freq: Four times a day (QID) | ORAL | 0 refills | Status: DC | PRN
Start: 2020-01-28 — End: 2020-11-21

## 2020-01-28 MED ORDER — LIDOCAINE HCL (CARDIAC) PF 100 MG/5ML IV SOSY
PREFILLED_SYRINGE | INTRAVENOUS | Status: DC | PRN
  Administered 2020-01-28: 100 mg via INTRAVENOUS

## 2020-01-28 MED ORDER — LIDOCAINE HCL (PF) 2 % IJ SOLN
INTRAMUSCULAR | Status: AC
  Filled 2020-01-28: qty 5

## 2020-01-28 MED ORDER — PHENYLEPHRINE HCL (PRESSORS) 10 MG/ML IV SOLN
INTRAVENOUS | Status: DC | PRN
  Administered 2020-01-28 (×2): 100 ug via INTRAVENOUS

## 2020-01-28 MED ORDER — MIDAZOLAM HCL 2 MG/2ML IJ SOLN
INTRAMUSCULAR | Status: AC
  Filled 2020-01-28: qty 2

## 2020-01-28 MED ORDER — KETOROLAC TROMETHAMINE 30 MG/ML IJ SOLN
INTRAMUSCULAR | Status: DC | PRN
  Administered 2020-01-28: 30 mg via INTRAVENOUS

## 2020-01-28 MED ORDER — DOCUSATE SODIUM 100 MG PO CAPS
100.0000 mg | ORAL_CAPSULE | Freq: Two times a day (BID) | ORAL | 0 refills | Status: AC
Start: 2020-01-28 — End: 2020-02-11

## 2020-01-28 MED ORDER — KETOROLAC TROMETHAMINE 30 MG/ML IJ SOLN
INTRAMUSCULAR | Status: AC
  Filled 2020-01-28: qty 1

## 2020-01-28 MED ORDER — ONDANSETRON HCL 4 MG/2ML IJ SOLN
INTRAMUSCULAR | Status: AC
  Filled 2020-01-28: qty 2

## 2020-01-28 MED ORDER — DOXYCYCLINE HYCLATE 100 MG PO TABS
200.0000 mg | ORAL_TABLET | Freq: Once | ORAL | Status: AC
  Administered 2020-01-28: 200 mg via ORAL
  Filled 2020-01-28: qty 2

## 2020-01-28 MED ORDER — PROMETHAZINE HCL 25 MG/ML IJ SOLN: 6.2500 mg | INTRAMUSCULAR | Status: DC | PRN

## 2020-01-28 MED ORDER — MIDAZOLAM HCL 2 MG/2ML IJ SOLN
INTRAMUSCULAR | Status: DC | PRN
  Administered 2020-01-28: 2 mg via INTRAVENOUS

## 2020-01-28 MED ORDER — DEXAMETHASONE SODIUM PHOSPHATE 10 MG/ML IJ SOLN
INTRAMUSCULAR | Status: DC | PRN
  Administered 2020-01-28: 5 mg via INTRAVENOUS

## 2020-01-28 MED ORDER — FENTANYL CITRATE (PF) 100 MCG/2ML IJ SOLN
INTRAMUSCULAR | Status: DC | PRN
  Administered 2020-01-28: 25 ug via INTRAVENOUS

## 2020-01-28 MED ORDER — FENTANYL CITRATE (PF) 100 MCG/2ML IJ SOLN: 25.0000 ug | INTRAMUSCULAR | Status: DC | PRN

## 2020-01-28 MED ORDER — FENTANYL CITRATE (PF) 100 MCG/2ML IJ SOLN
INTRAMUSCULAR | Status: AC
  Filled 2020-01-28: qty 2

## 2020-01-28 MED ORDER — EPHEDRINE 5 MG/ML INJ
INTRAVENOUS | Status: AC
  Filled 2020-01-28: qty 10

## 2020-01-28 SURGICAL SUPPLY — 21 items
CATH ROBINSON RED A/P 16FR (CATHETERS) ×2 IMPLANT
COVER WAND RF STERILE (DRAPES) ×2 IMPLANT
FILTER UTR ASPR SPEC (MISCELLANEOUS) ×1 IMPLANT
FLTR UTR ASPR SPEC (MISCELLANEOUS) ×2
GLOVE BIO SURGEON STRL SZ7 (GLOVE) ×2 IMPLANT
GLOVE INDICATOR 7.5 STRL GRN (GLOVE) ×2 IMPLANT
GOWN STRL REUS W/ TWL LRG LVL3 (GOWN DISPOSABLE) ×2 IMPLANT
GOWN STRL REUS W/TWL LRG LVL3 (GOWN DISPOSABLE) ×2
KIT BERKELEY 1ST TRIMESTER 3/8 (MISCELLANEOUS) ×2 IMPLANT
KIT TURNOVER CYSTO (KITS) ×2 IMPLANT
PACK DNC HYST (MISCELLANEOUS) ×2 IMPLANT
PAD OB MATERNITY 4.3X12.25 (PERSONAL CARE ITEMS) ×2 IMPLANT
PAD PREP 24X41 OB/GYN DISP (PERSONAL CARE ITEMS) ×2 IMPLANT
SET BERKELEY SUCTION TUBING (SUCTIONS) ×2 IMPLANT
TOWEL OR 17X26 4PK STRL BLUE (TOWEL DISPOSABLE) IMPLANT
VACURETTE 10 RIGID CVD (CANNULA) IMPLANT
VACURETTE 6 ASPIR F TIP BERK (CANNULA) IMPLANT
VACURETTE 7MM F TIP (CANNULA) ×1
VACURETTE 7MM F TIP STRL (CANNULA) ×1 IMPLANT
VACURETTE 8 RIGID CVD (CANNULA) IMPLANT
VACURETTE 8MM F TIP (MISCELLANEOUS) IMPLANT

## 2020-01-28 NOTE — Discharge Instructions (Signed)
AMBULATORY SURGERY  DISCHARGE INSTRUCTIONS   1) The drugs that you were given will stay in your system until tomorrow so for the next 24 hours you should not:  A) Drive an automobile B) Make any legal decisions C) Drink any alcoholic beverage   2) You may resume regular meals tomorrow.  Today it is better to start with liquids and gradually work up to solid foods.  You may eat anything you prefer, but it is better to start with liquids, then soup and crackers, and gradually work up to solid foods.   3) Please notify your doctor immediately if you have any unusual bleeding, trouble breathing, redness and pain at the surgery site, drainage, fever, or pain not relieved by medication.  4) Your post-operative visit with Dr.                                     is: Date:                        Time:    Please call to schedule your post-operative visit.  5) Additional Instructions: Discharge instructions after a Dilation and Curettage  Signs and Symptoms to Report  Call our office at [phone] if you have any of the following:   . Fever over 100.4 degrees or higher . Severe stomach pain not relieved with pain medications . Bright red bleeding that's heavier than a period that does not slow with rest after the first 24 hours . To go the bathroom a lot (frequency), you can't hold your urine (urgency), or it hurts when you empty your bladder (urinate) . Chest pain . Shortness of breath . Pain in the calves of your legs . Severe nausea and vomiting not relieved with anti-nausea medications . Any concerns  What You Can Expect after Surgery . You may see some pink tinged, bloody fluid. This is normal. You may also have cramping for several days.   Activities after Your Discharge Follow these guidelines to help speed your recovery at home: . Don't drive if you are in pain or taking narcotic pain medicine. You may drive when you can safely slam on the brakes, turn the wheel  forcefully, and rotate your torso comfortably. This is typically 4-7 days. Practice in a parking lot or side street prior to attempting to drive regularly.  . Ask others to help with household chores until you feel up to doing tasks. . Don't do strenuous activities, exercises, or sports like vacuuming, tennis, squash, etc. until your doctor says it is safe to do so. . Walk as you feel able. Rest often since it may take a week or two for your energy level to return to normal.  . You may climb stairs . Avoid constipation:   -Eat fruits, vegetables, and whole grains. Eat small meals as your appetite will take time to return to normal.   -Drink 6 to 8 glasses of water each day unless your doctor has told you to limit your fluids.   -Use a laxative or stool softener as needed if constipation becomes a problem. You may take Miralax, metamucil, Citrucil, Colace, Senekot, FiberCon, etc. If this does not relieve the constipation, try two tablespoons of Milk Of Magnesia every 8 hours until your bowels move.  . You may shower.  . Do not get in a hot tub, swimming pool, etc. until  your doctor agrees. . Do not douche, use tampons, or have sex until you stop spotting, usually about 2 weeks. . Take your pain medicine when you need it. The medicine may not work as well if the pain is bad.  Take the medicines you were taking before surgery. Other medications you might need are pain medications (ibuprofen), medications for constipation (Colace) and nausea medications (Zofran).       Coping with Pregnancy Loss Pregnancy loss can happen any time during a pregnancy. Often the cause is not known. It is rarely because of anything you did. Pregnancy loss in early pregnancy (during the first trimester) is called a miscarriage. This type of pregnancy loss is the most common.   Any pregnancy loss can be devastating. You will need to recover both physically and emotionally. Most women are able to get pregnant again after  a pregnancy loss and deliver a healthy baby.  How to manage emotional recovery  Pregnancy loss is very hard emotionally. You may feel many different emotions while you grieve. You may feel sad and angry. You may also feel guilty. It is normal to have periods of crying. Emotional recovery can take longer than physical recovery. It is different for everyone. Some women may feel back to normal quickly and others take longer. Taking these steps can help you cope:  Remember that it is unlikely you did anything to cause the pregnancy loss.  Share your thoughts and feelings with friends, family, and your partner. Remember that your partner is also recovering emotionally.  Make sure you have a good support system, and do not spend too much time alone.  Meet with a pregnancy loss counselor or join a pregnancy loss support group.  Get enough sleep and eat a healthy diet. Return to regular exercise when you have recovered physically.  Do not use drugs or alcohol to manage your emotions.  Consider seeing a mental health professional to help you recover emotionally.  Ask a friend or loved one to help you decide what to do with any clothing and nursery items you received for your baby.  How to recognize emotional stress It is normal to have emotional stress after a pregnancy loss. But emotional stress that lasts a long time or becomes severe requires treatment. Watch out for these signs of severe emotional stress:  Sadness, anger, or guilt that is not going away and is interfering with your normal activities.  Relationship problems that have occurred or gotten worse since the pregnancy loss.  Signs of depression that last longer than 2 weeks. These may include: ? Sadness. ? Anxiety. ? Hopelessness. ? Loss of interest in activities you enjoy. ? Inability to concentrate. ? Trouble sleeping or sleeping too much. ? Loss of appetite or overeating. ? Thoughts of death or of hurting  yourself. Follow these instructions at home: Medicines  Take over-the-counter and prescription medicines only as told by your health care provider. Activity  Rest at home until your energy level returns. Return to your normal activities as told by your health care provider. Ask your health care provider what activities are safe for you. General instructions  Keep all follow-up visits as told by your health care provider. This is important.  It may be helpful to meet with others who have experienced pregnancy loss. Ask your health care provider about support groups and resources.  To help you and your partner with the process of grieving, talk with your health care provider or seek counseling.  When  you are ready, meet with your health care provider to discuss steps to take for a future pregnancy. Where to find more information  U.S. Department of Health and Programmer, systems on Women's Health: [place]  American Pregnancy Association: www.americanpregnancy.org Contact a health care provider if:  You continue to experience grief, sadness, or lack of motivation for everyday activities, and those feelings do not improve over time.  You are struggling to recover emotionally, especially if you are using alcohol or substances to help. Get help right away if:  You have thoughts of hurting yourself or others. If you ever feel like you may hurt yourself or others, or have thoughts about taking your own life, get help right away. You can go to your nearest emergency department or call:  Your local emergency services (911 in the [place]  A suicide crisis helpline, such as the [place] at 781-330-1570. This is open 24 hours a day. Summary  Any pregnancy loss can be difficult physically and emotionally.  You may experience many different emotions while you grieve. Emotional recovery can last longer than physical recovery.  It is normal to have  emotional stress after a pregnancy loss. But emotional stress that lasts a long time or becomes severe requires treatment.  See your health care provider if you are struggling emotionally after a pregnancy loss. This information is not intended to replace advice given to you by your health care provider. Make sure you discuss any questions you have with your health care provider. Document Released: 10/23/2017 Document Revised: 10/23/2017 Document Reviewed: 10/23/2017 Elsevier Interactive Patient Education  2019 [employer].

## 2020-01-28 NOTE — ED Triage Notes (Signed)
Pt states that she is seen by [facility] clinic obgyn, states that she had a miscarriage and was prescribed pills to place in the vagina to assist with the miscarriage, had an u/s last week and was told she was supposed to be 8 weeks at that time, pt used the pills last pm, and now is passing a large amount of blood each time she changes positions. Pt states that she has changed her pad 10 times since 0800

## 2020-01-28 NOTE — Op Note (Signed)
Operative Report Suction Dilation and Curettage   Indications: Heavy vaginal bleeding   Pre-operative Diagnosis: Incomplete abortion at 6 weeks  Post-operative Diagnosis: same.  Procedure: 1. Suction D&C  Surgeon: Christeen Douglas, MD  Assistant(s):  None  Anesthesia: General LMA anesthesia  Anesthesiologist: Lenard Simmer, MD Anesthesiologist: Lenard Simmer, MD CRNA: Elmarie Mainland, CRNA  Estimated Blood Loss:  3ml         Intraoperative medications: toradol         Total IV Fluids:  Urine Output: 67ml         Specimens: Products of conception         Complications:  None; patient tolerated the procedure well.         Disposition: PACU - hemodynamically stable.         Condition: stable  Findings: Uterus measuring 7 weeks; normal cervix, vagina, perineum.   Indication for procedure/Consents: 34 y.o. G1P0 at 9wks by LMP but 6 wks by ultrasound, s/p vaginal misoprostol last night but with very heavy vaginal bleeding for 8 hours now, coming up from the ER for add-on urgent surgery for the aforementioned diagnoses.  Risks of surgery were discussed with the patient including but not limited to: bleeding which may require transfusion; infection which may require antibiotics; injury to uterus or surrounding organs; intrauterine scarring which may impair future fertility; need for additional procedures including laparotomy or laparoscopy; and other postoperative/anesthesia complications. Written informed consent was obtained.    Procedure Details:   The patient received oral antibiotics while in the preoperative area.  She was then taken to the operating room where general anesthesia was administered and was found to be adequate.  After a formal and adequate timeout was performed, she was placed in the dorsal lithotomy position and examined with the above findings. She was then prepped and draped in the sterile manner.   Her bladder was catheterized for an estimated  amount of clear, yellow urine. A speculum was then placed in the patient's vagina and a single tooth tenaculum was applied to the anterior lip of the cervix.    No uterine sounding was performed on this pregnant uterus. Her cervix was serially dilated to accommodate a 7 sized flexible suction curette.  A gentle sharp curettage was then performed until there was a gritty texture in all four quadrants.  The tenaculum was removed from the anterior lip of the cervix, pressure and silver nitrate applied, and the vaginal speculum was removed after noting good hemostasis. The patient tolerated the procedure well and was taken to the recovery area awake, extubated and in stable condition.  The patient will be discharged to home as per PACU criteria.  She will receive another dose of oral antibiotics prior to discharge. Routine postoperative instructions given.  She was prescribed zofran, Ibuprofen and Colace.  She will follow up in the clinic in two weeks for postoperative evaluation.

## 2020-01-28 NOTE — Anesthesia Postprocedure Evaluation (Signed)
Anesthesia Post Note  Patient: Deborah Stafford  Procedure(s) Performed: DILATATION AND CURETTAGE /HYSTEROSCOPY (N/A )  Patient location during evaluation: PACU Anesthesia Type: General Level of consciousness: awake and alert Pain management: pain level controlled Vital Signs Assessment: post-procedure vital signs reviewed and stable Respiratory status: spontaneous breathing, nonlabored ventilation, respiratory function stable and patient connected to nasal cannula oxygen Cardiovascular status: blood pressure returned to baseline and stable Postop Assessment: no apparent nausea or vomiting Anesthetic complications: no     Last Vitals:  Vitals:   01/28/20 1745 01/28/20 1800  BP: 111/76 108/77  Pulse: (!) 114 86  Resp: 16 11  Temp:  (!) 36.1 C  SpO2: 99% 100%    Last Pain:  Vitals:   01/28/20 1800  TempSrc:   PainSc: 0-No pain                 Lenard Simmer

## 2020-01-28 NOTE — Consult Note (Signed)
Consult History and Physical   SERVICE: Gynecology   Patient Name: Deborah Stafford Patient MRN:   [id number]  CC: Heavy vaginal bleeding  HPI: Deborah Stafford is a 34 y.o. G1P0 at [redacted]w[redacted]d by LMP but [redacted]w[redacted]d by ultrasound yesterday,with a failed IUD, s/p 884mcg cytotec vaginally last night at 8pm. This morning has gone through more than 2 fully saturated pads per hour since 7am, and presented after talking to the office with concerns for heavy bleeding.  Beta hcg  5/26: 47000 5/28: 45000 6/3: 35700 6/4: 13,644 today at [facility]   Review of Systems: positives in bold GEN:   fevers, chills, weight changes, appetite changes, fatigue, night sweats HEENT:  HA, vision changes, hearing loss, congestion, rhinorrhea, sinus pressure, dysphagia CV:   CP, palpitations PULM:  SOB, cough GI:  abd pain, N/V/D/C, +vaginal bleeding GU:  dysuria, urgency, frequency MSK:  arthralgias, myalgias, back pain, swelling SKIN:  rashes, color changes, pallor NEURO:  numbness, weakness, tingling, seizures, dizziness, tremors PSYCH:  depression, anxiety, behavioral problems, confusion  HEME/LYMPH:  easy bruising or bleeding ENDO:  heat/cold intolerance  Past Obstetrical History: OB History   No obstetric history on file.     Past Gynecologic History: No LMP recorded.  Past Medical History: Past Medical History:  Diagnosis Date  . Irregular heartbeat   . Varicose veins of leg with edema, right     Past Surgical History:   Past Surgical History:  Procedure Laterality Date  . BREAST RECONSTRUCTION Right 2001  . ENDOVENOUS ABLATION SAPHENOUS VEIN W/ LASER Right 01/13/2018   endovenous laser ablation right greater saphenous vein and stab phlebectomy > 20 incisions right leg by Tinnie Gens MD   . HAND SURGERY     Web fingering    Family History:  family history includes Cancer in her maternal grandfather and paternal grandfather; Diabetes in her maternal grandfather; Hyperlipidemia in her  father; Hypertension in her mother.  Social History:  Social History   Socioeconomic History  . Marital status: Married    Spouse name: Not on file  . Number of children: Not on file  . Years of education: Not on file  . Highest education level: Not on file  Occupational History  . Not on file  Tobacco Use  . Smoking status: Never Smoker  . Smokeless tobacco: Never Used  Substance and Sexual Activity  . Alcohol use: Yes    Alcohol/week: 1.0 standard drinks    Types: 1 Cans of beer per week  . Drug use: No  . Sexual activity: Not on file  Other Topics Concern  . Not on file  Social History Narrative   ** Merged History Encounter **       Social Determinants of Health   Financial Resource Strain:   . Difficulty of Paying Living Expenses:   Food Insecurity:   . Worried About Charity fundraiser in the Last Year:   . Arboriculturist in the Last Year:   Transportation Needs:   . Film/video editor (Medical):   Marland Kitchen Lack of Transportation (Non-Medical):   Physical Activity:   . Days of Exercise per Week:   . Minutes of Exercise per Session:   Stress:   . Feeling of Stress :   Social Connections:   . Frequency of Communication with Friends and Family:   . Frequency of Social Gatherings with Friends and Family:   . Attends Religious Services:   . Active Member of Clubs or Organizations:   .  Attends Banker Meetings:   Marland Kitchen Marital Status:   Intimate Partner Violence:   . Fear of Current or Ex-Partner:   . Emotionally Abused:   Marland Kitchen Physically Abused:   . Sexually Abused:     Home Medications:  Medications reconciled in EPIC  No current facility-administered medications on file prior to encounter.   Current Outpatient Medications on File Prior to Encounter  Medication Sig Dispense Refill  . amphetamine-dextroamphetamine (ADDERALL) 15 MG tablet Take 15 mg by mouth 2 (two) times daily.    . clindamycin-benzoyl peroxide (BENZACLIN) gel Apply topically 2  (two) times daily. To affected areas 25 g 0  . loratadine (CLARITIN) 10 MG tablet Take 10 mg by mouth continuous as needed for allergies.    . Multiple Vitamin (MULTIVITAMIN) tablet Take 1 tablet by mouth daily.    . norethindrone-ethinyl estradiol (MICROGESTIN,JUNEL,LOESTRIN) 1-20 MG-MCG tablet Take 1 tablet by mouth daily.    . predniSONE (DELTASONE) 5 MG tablet Take 1 tablet (5 mg total) by mouth as directed. Taper 6,5,4,3,2,1 (Patient not taking: Reported on 01/20/2018) 21 tablet 0    Allergies:  Allergies  Allergen Reactions  . Sulfa Antibiotics     Fever and redness per pt    Physical Exam:  Temp:  [98.7 F (37.1 C)] 98.7 F (37.1 C) (06/04 1136) Pulse Rate:  [87] 87 (06/04 1136) Resp:  [16] 16 (06/04 1136) BP: (102)/(63) 102/63 (06/04 1136) SpO2:  [99 %] 99 % (06/04 1136) Weight:  [68 kg] 68 kg (06/04 1136)   General Appearance:  Well developed, well nourished, no acute distress, alert and oriented x3 HEENT:  Normocephalic atraumatic, extraocular movements intact, moist mucous membranes Cardiovascular:  Normal S1/S2, regular rate and rhythm, no murmurs Pulmonary:  clear to auscultation, no wheezes, rales or rhonchi, symmetric air entry, good air exchange Abdomen:  Bowel sounds present, soft, nontender, nondistended, no abnormal masses, no epigastric pain Extremities:  Full range of motion, no pedal edema, 2+ distal pulses, no tenderness Skin:  normal coloration and turgor, no rashes, no suspicious skin lesions noted  Neurologic:  Cranial nerves 2-12 grossly intact, normal muscle tone, strength 5/5 all four extremities Psychiatric:  Normal mood and affect, appropriate, no AH/VH Pelvic: deferred  Labs/Studies:   CBC and Coags:  Lab Results  Component Value Date   WBC 8.8 01/28/2020   HGB 12.4 01/28/2020   HCT 36.4 01/28/2020   MCV 89.0 01/28/2020   PLT 183 01/28/2020   CMP:  Lab Results  Component Value Date   NA 138 01/28/2020   K 3.9 01/28/2020   CL 104  01/28/2020   CO2 25 01/28/2020   BUN 14 01/28/2020   CREATININE 0.50 01/28/2020      Assessment / Plan:   Deborah Stafford is a 34 y.o. G1P0 at 6w by imaging who presents with failed IUP and heavy vaginal bleeding.  Incomplete spontaneous miscarriage: Causes of spontaneous miscarriage discussed with patient, including prevalence, common causes, and the expectation that this event does not increase the chance that she will miscarry again in the future. Emotional support given.  Management options discussed, including: Expectant, medical and surgical.  Pt has opted for: suction D&C  We have to wait for her covid swab to return, which will be about 2 hours from now.  Consents signed today. Risks of surgery were discussed with the patient including but not limited to: bleeding which may require transfusion; infection which may require antibiotics; injury to uterus or surrounding organs; intrauterine scarring  which may impair future fertility; need for additional procedures including laparotomy or laparoscopy; and other postoperative/anesthesia complications. Written informed consent was obtained.  This is an add-on same-day surgery. She will have a postop visit in 2 weeks to review operative findings and pathology.

## 2020-01-28 NOTE — ED Provider Notes (Signed)
[facility] Emergency Department Provider Note   ____________________________________________   First MD Initiated Contact with Patient 01/28/20 1408     (approximate)  I have reviewed the triage vital signs and the nursing notes.   HISTORY  Chief Complaint Vaginal Bleeding    HPI Deborah Stafford is a 34 y.o. female here for evaluation of vaginal bleeding  Patient reports that she had a miscarriage occur, she was seen in the [facility] clinic and  referred to the ER for further evaluation after significant bleeding after evaluated yesterday for a possible miscarriage  Patient reports she got up last night had cramping and some vaginal bleeding, but this morning had severe vaginal bleeding using about 8 pads in an hour this morning.  No lightheadedness or weakness no chest pain no fevers or chills.    Bleeding seems to have started to slow down.  Not any pain or discomfort at this time but when she does have it seems to be crampy vaginal type discomfort  No history of known bleeding disorders.  She called her OB/GYN provider who referred her to the ER  Past Medical History:  Diagnosis Date  . Irregular heartbeat   . Varicose veins of leg with edema, right     Patient Active Problem List   Diagnosis Date Noted  . Pain in limb 01/13/2013  . Swelling of limb 01/13/2013  . Varicose veins of right lower extremity with complications 34/74/2595    Past Surgical History:  Procedure Laterality Date  . BREAST RECONSTRUCTION Right 2001  . ENDOVENOUS ABLATION SAPHENOUS VEIN W/ LASER Right 01/13/2018   endovenous laser ablation right greater saphenous vein and stab phlebectomy > 20 incisions right leg by Tinnie Gens MD   . HAND SURGERY     Web fingering    Prior to Admission medications   Medication Sig Start Date End Date Taking? Authorizing Provider  amphetamine-dextroamphetamine (ADDERALL) 15 MG tablet Take 15 mg by mouth 2 (two) times daily.     [provider]  loratadine (CLARITIN) 10 MG tablet Take 10 mg by mouth continuous as needed for allergies.    [provider]  Multiple Vitamin (MULTIVITAMIN) tablet Take 1 tablet by mouth daily.    [provider]  norethindrone-ethinyl estradiol (MICROGESTIN,JUNEL,LOESTRIN) 1-20 MG-MCG tablet Take 1 tablet by mouth daily.    [provider]    Allergies Sulfa antibiotics  Family History  Problem Relation Age of Onset  . Hypertension Mother   . Hyperlipidemia Father        under control  . Cancer Maternal Grandfather        Lung  . Diabetes Maternal Grandfather        Type II  . Cancer Paternal Grandfather        Lung    Social History Social History   Tobacco Use  . Smoking status: Never Smoker  . Smokeless tobacco: Never Used  Substance Use Topics  . Alcohol use: Yes    Alcohol/week: 1.0 standard drinks    Types: 1 Cans of beer per week  . Drug use: No    Review of Systems Constitutional: No fever/chills Cardiovascular: Denies chest pain. Respiratory: Denies shortness of breath. Gastrointestinal: No abdominal pain.  Some occasional cramping discomfort. Genitourinary: See HPI Musculoskeletal: Negative for back pain. Skin: Negative for rash. Neurological: Negative for headaches or weakness.    ____________________________________________   PHYSICAL EXAM:  VITAL SIGNS: ED Triage Vitals  Enc Vitals Group  BP 01/28/20 1136 102/63     Pulse Rate 01/28/20 1136 87     Resp 01/28/20 1136 16     Temp 01/28/20 1136 98.7 F (37.1 C)     Temp Source 01/28/20 1136 Oral     SpO2 01/28/20 1136 99 %     Weight 01/28/20 1136 150 lb (68 kg)     Height 01/28/20 1136 5\' 4"  (1.626 m)     Head Circumference --      Peak Flow --      Pain Score 01/28/20 1141 0     Pain Loc --      Pain Edu? --      Excl. in GC? --     Constitutional: Alert and oriented. Well appearing and in no acute distress.  Patient is able to ambulate  independently. Eyes: Conjunctivae are normal. Head: Atraumatic. Nose: No congestion/rhinnorhea. Mouth/Throat: Mucous membranes are moist. Neck: No stridor.  Cardiovascular: Normal rate, regular rhythm. Grossly normal heart sounds.  Good peripheral circulation. Respiratory: Normal respiratory effort.  No retractions. Lungs CTAB. Gastrointestinal: Soft and nontender. No distention. Musculoskeletal: No lower extremity tenderness nor edema. Neurologic:  Normal speech and language. No gross focal neurologic deficits are appreciated.  Skin:  Skin is warm, dry and intact. No rash noted. Psychiatric: Mood and affect are normal. Speech and behavior are normal.  ____________________________________________   LABS (all labs ordered are listed, but only abnormal results are displayed)  Labs Reviewed  HCG, QUANTITATIVE, PREGNANCY - Abnormal; Notable for the following components:      Result Value   hCG, Beta Chain, Quant, S 13,644 (*)    All other components within normal limits  SARS CORONAVIRUS 2 BY RT PCR (HOSPITAL ORDER, PERFORMED IN [facility])  CBC  BASIC METABOLIC PANEL  PROTIME-INR  APTT  TYPE AND SCREEN   ____________________________________________  EKG   ____________________________________________  RADIOLOGY   ____________________________________________   PROCEDURES  Procedure(s) performed: None  Procedures  Critical Care performed: No  ____________________________________________   INITIAL IMPRESSION / ASSESSMENT AND PLAN / ED COURSE  Pertinent labs & imaging results that were available during my care of the patient were reviewed by me and considered in my medical decision making (see chart for details).   Patient was with vaginal bleeding, seen and evaluated in consultation by OB/GYN Dr. 03/29/20.  Decision has been made b7 OBGYN consult Dalbert Garnet) to take the patient to the [facility] service, likely to the OR for D&C.  Denies any acute cardiac or  pulmonary symptoms.  No acute infectious symptoms to noted.  Patient is hemodynamically stable.  Reassuring hemoglobin, however does report fairly heavy bleeding seems to have slowed. Rh + in [facility] system labs.   Deborah Stafford was evaluated in Emergency Department on 01/28/2020 for the symptoms described in the history of present illness. She was evaluated in the context of the global COVID-19 pandemic, which necessitated consideration that the patient might be at risk for infection with the SARS-CoV-2 virus that causes COVID-19. Institutional protocols and algorithms that pertain to the evaluation of patients at risk for COVID-19 are in a state of rapid change based on information released by regulatory bodies including the CDC and federal and state organizations. These policies and algorithms were followed during the patient's care in the ED.       ____________________________________________   FINAL CLINICAL IMPRESSION(S) / ED DIAGNOSES  Final diagnoses:  Vaginal bleeding        Note:  This document  was prepared using Conservation officer, historic buildings and may include unintentional dictation errors       Sharyn Creamer, MD 01/28/20 [phone]

## 2020-01-28 NOTE — Transfer of Care (Signed)
Immediate Anesthesia Transfer of Care Note  Patient: Deborah Stafford  Procedure(s) Performed: DILATATION AND CURETTAGE /HYSTEROSCOPY (N/A )  Patient Location: PACU  Anesthesia Type:General  Level of Consciousness: drowsy  Airway & Oxygen Therapy: Patient Spontanous Breathing and Patient connected to face mask oxygen  Post-op Assessment: Report given to RN and Post -op Vital signs reviewed and stable  Post vital signs: Reviewed and stable  Last Vitals:  Vitals Value Taken Time  BP 122/77 01/28/20 1730  Temp    Pulse 84 01/28/20 1733  Resp 21 01/28/20 1733  SpO2 100 % 01/28/20 1733  Vitals shown include unvalidated device data.  Last Pain:  Vitals:   01/28/20 1349  TempSrc:   PainSc: 0-No pain         Complications: No apparent anesthesia complications

## 2020-01-28 NOTE — Anesthesia Procedure Notes (Signed)
Procedure Name: LMA Insertion Date/Time: 01/28/2020 4:58 PM Performed by: Elmarie Mainland, CRNA Pre-anesthesia Checklist: Emergency Drugs available, Suction available, Patient identified and Patient being monitored Patient Re-evaluated:Patient Re-evaluated prior to induction Oxygen Delivery Method: Circle system utilized Preoxygenation: Pre-oxygenation with 100% oxygen Induction Type: IV induction Ventilation: Mask ventilation without difficulty LMA: LMA inserted LMA Size: 3.5 Number of attempts: 1 Placement Confirmation: positive ETCO2 and breath sounds checked- equal and bilateral Tube secured with: Tape Dental Injury: Teeth and Oropharynx as per pre-operative assessment

## 2020-01-28 NOTE — Anesthesia Preprocedure Evaluation (Signed)
Anesthesia Evaluation  Patient identified by MRN, date of birth, ID band Patient awake    Reviewed: Allergy & Precautions, H&P , NPO status , Patient's Chart, lab work & pertinent test results, reviewed documented beta blocker date and time   History of Anesthesia Complications Negative for: history of anesthetic complications  Airway Mallampati: I  TM Distance: >3 FB Neck ROM: full    Dental  (+) Caps, Dental Advidsory Given, Missing, Teeth Intact   Pulmonary neg pulmonary ROS,    Pulmonary exam normal breath sounds clear to auscultation       Cardiovascular Exercise Tolerance: Good (-) hypertension(-) angina(-) Past MI and (-) Cardiac Stents Normal cardiovascular exam+ dysrhythmias (-) Valvular Problems/Murmurs Rhythm:regular Rate:Normal     Neuro/Psych negative neurological ROS  negative psych ROS   GI/Hepatic negative GI ROS, Neg liver ROS,   Endo/Other  negative endocrine ROS  Renal/GU negative Renal ROS  negative genitourinary   Musculoskeletal   Abdominal   Peds  Hematology negative hematology ROS (+)   Anesthesia Other Findings Past Medical History: No date: Irregular heartbeat No date: Varicose veins of leg with edema, right   Reproductive/Obstetrics (+) Pregnancy (missed AB at 9 weeks)                             Anesthesia Physical Anesthesia Plan  ASA: II  Anesthesia Plan: General   Post-op Pain Management:    Induction: Intravenous  PONV Risk Score and Plan: 3 and Ondansetron, Dexamethasone, Midazolam, Promethazine and Treatment may vary due to age or medical condition  Airway Management Planned: LMA  Additional Equipment:   Intra-op Plan:   Post-operative Plan: Extubation in OR  Informed Consent: I have reviewed the patients History and Physical, chart, labs and discussed the procedure including the risks, benefits and alternatives for the proposed anesthesia  with the patient or authorized representative who has indicated his/her understanding and acceptance.     Dental Advisory Given  Plan Discussed with: Anesthesiologist, CRNA and Surgeon  Anesthesia Plan Comments:         Anesthesia Quick Evaluation

## 2020-02-02 LAB — SURGICAL PATHOLOGY

## 2020-04-14 ENCOUNTER — Other Ambulatory Visit: Payer: Self-pay

## 2020-04-14 DIAGNOSIS — Z0000 Encounter for general adult medical examination without abnormal findings: Secondary | ICD-10-CM

## 2020-04-14 DIAGNOSIS — Z1159 Encounter for screening for other viral diseases: Secondary | ICD-10-CM

## 2020-04-15 LAB — HIV ANTIBODY (ROUTINE TESTING W REFLEX): HIV Screen 4th Generation wRfx: NONREACTIVE

## 2020-04-15 LAB — CBC WITH DIFFERENTIAL/PLATELET
Basophils Absolute: 0 10*3/uL (ref 0.0–0.2)
Basos: 0 %
EOS (ABSOLUTE): 0.1 10*3/uL (ref 0.0–0.4)
Eos: 1 %
Hematocrit: 41.9 % (ref 34.0–46.6)
Hemoglobin: 13.9 g/dL (ref 11.1–15.9)
Immature Grans (Abs): 0 10*3/uL (ref 0.0–0.1)
Immature Granulocytes: 0 %
Lymphocytes Absolute: 2.3 10*3/uL (ref 0.7–3.1)
Lymphs: 34 %
MCH: 29.3 pg (ref 26.6–33.0)
MCHC: 33.2 g/dL (ref 31.5–35.7)
MCV: 88 fL (ref 79–97)
Monocytes Absolute: 0.4 10*3/uL (ref 0.1–0.9)
Monocytes: 7 %
Neutrophils Absolute: 3.9 10*3/uL (ref 1.4–7.0)
Neutrophils: 58 %
Platelets: 212 10*3/uL (ref 150–450)
RBC: 4.74 x10E6/uL (ref 3.77–5.28)
RDW: 12.1 % (ref 11.7–15.4)
WBC: 6.7 10*3/uL (ref 3.4–10.8)

## 2020-04-15 LAB — COMPREHENSIVE METABOLIC PANEL
ALT: 12 IU/L (ref 0–32)
AST: 14 IU/L (ref 0–40)
Albumin/Globulin Ratio: 2.3 — ABNORMAL HIGH (ref 1.2–2.2)
Albumin: 4.8 g/dL (ref 3.8–4.8)
Alkaline Phosphatase: 67 IU/L (ref 48–121)
BUN/Creatinine Ratio: 28 — ABNORMAL HIGH (ref 9–23)
BUN: 17 mg/dL (ref 6–20)
Bilirubin Total: 0.4 mg/dL (ref 0.0–1.2)
CO2: 25 mmol/L (ref 20–29)
Calcium: 9.5 mg/dL (ref 8.7–10.2)
Chloride: 103 mmol/L (ref 96–106)
Creatinine, Ser: 0.6 mg/dL (ref 0.57–1.00)
GFR calc Af Amer: 138 mL/min/{1.73_m2} (ref >59)
GFR calc non Af Amer: 119 mL/min/{1.73_m2} (ref >59)
Globulin, Total: 2.1 g/dL (ref 1.5–4.5)
Glucose: 85 mg/dL (ref 65–99)
Potassium: 4.2 mmol/L (ref 3.5–5.2)
Sodium: 139 mmol/L (ref 134–144)
Total Protein: 6.9 g/dL (ref 6.0–8.5)

## 2020-04-15 LAB — LIPID PANEL
Chol/HDL Ratio: 3.6 ratio (ref 0.0–4.4)
Cholesterol, Total: 199 mg/dL (ref 100–199)
HDL: 55 mg/dL (ref >39)
LDL Chol Calc (NIH): 132 mg/dL — ABNORMAL HIGH (ref 0–99)
Triglycerides: 65 mg/dL (ref 0–149)
VLDL Cholesterol Cal: 12 mg/dL (ref 5–40)

## 2020-04-15 LAB — TSH: TSH: 1.83 u[IU]/mL (ref 0.450–4.500)

## 2020-04-15 LAB — SAR COV2 SEROLOGY (COVID19)AB(IGG),IA

## 2020-04-20 ENCOUNTER — Ambulatory Visit: Payer: Self-pay | Admitting: Medical

## 2020-11-21 ENCOUNTER — Telehealth: Payer: Self-pay | Admitting: Medical

## 2020-11-21 ENCOUNTER — Ambulatory Visit: Payer: Self-pay

## 2020-11-21 ENCOUNTER — Other Ambulatory Visit: Payer: Self-pay

## 2020-11-21 DIAGNOSIS — Z20822 Contact with and (suspected) exposure to covid-19: Secondary | ICD-10-CM

## 2020-11-21 LAB — POC COVID19 BINAXNOW: SARS Coronavirus 2 Ag: NEGATIVE

## 2020-11-21 NOTE — Progress Notes (Signed)
Subjective:    Patient ID: Deborah Stafford, female    DOB: 11-07-1985, 35 y.o.   MRN: [id number]  HPI 35 yo female in non acute distress consents to telemedicine appointment.  Husband tested for Covid-19 which was negative.,  But he had fevers  Cough, nasal congestion runny nose last week, patient got a similar cough to husbands on Friday.   Patient had Covid -19 at Christmas time  (December) 2021. Outside Thursday and Friday 11/17/20 so she thought her symptoms were due to seasonal allergies..  She Contacted HR and it was determined that she was a close contact ( by her husband)  They recommended  She come in for Covid-19 testing ( close contact being her husband).  O2 sat 100%  Her current symptoms are cough nonproductive,  and improving, she started taking Claritin ( the end of last week) which she feels all her symptoms have improved on this medication, nassal congestion, runny nose denies SOB, CP and no HA.    Allergies  Allergen Reactions  . Sulfa Antibiotics Other (See Comments)    Fever and redness per pt, nausea, skin hot and painful   Vacccinated Pfizer and boosted.  Review of Systems  Constitutional: Negative for chills, fatigue and fever.  HENT: Positive for congestion and rhinorrhea (most of last week).   Respiratory: Positive for cough (dry). Negative for chest tightness, shortness of breath and wheezing.   Cardiovascular: Negative for chest pain.  Gastrointestinal: Negative for abdominal pain and diarrhea.  Musculoskeletal: Negative for myalgias (mild on Friday).  Skin: Negative for color change and rash.  Allergic/Immunologic: Positive for environmental allergies (started taking Claritin).  Neurological: Negative for dizziness, syncope, light-headedness and headaches.       Objective:   Physical Exam  AxOx3 No distress noted on phone call. No physical exam performed due to telemedicine appointment.     Results for orders placed or performed in  visit on 11/21/20 (from the past 24 hour(s))  POC COVID-19     Status: Normal   Collection Time: 11/21/20  3:29 PM  Result Value Ref Range   SARS Coronavirus 2 Ag Negative Negative   Assessment & Plan:  Covid-19 screening POC Covid-19 test negative, PCR pending.Will call patient once PCR has resulted. Continue Claritin, Isolate, rest, OTC Claritin per package instructions. She also has Flonase but perfers not to use it, because she does not like it. She has used the Flonase when there is increased pollen and allergy symptoms.Will reevaluate patient return to work status once PCR has resulted. Patient verbalizes understanding and has no questions at the end of our conversation. Orders Placed This Encounter  Procedures  . Novel Coronavirus, NAA ([facility])    Order Specific Question:   Is this test for diagnosis or screening    Answer:   Diagnosis of ill patient    Order Specific Question:   Symptomatic for COVID-19 as defined by CDC    Answer:   Yes    Order Specific Question:   Date of Symptom Onset    Answer:   11/17/2020    Order Specific Question:   Hospitalized for COVID-19    Answer:   No    Order Specific Question:   Admitted to ICU for COVID-19    Answer:   No    Order Specific Question:   Previously tested for COVID-19    Answer:   Yes    Order Specific Question:   Resident in a congregate (group) care setting  Answer:   No    Order Specific Question:   Is the patient student?    Answer:   No    Order Specific Question:   Employed in healthcare setting    Answer:   No    Order Specific Question:   Pregnant    Answer:   No    Order Specific Question:   Has patient completed COVID vaccination(s) (2 doses of Pfizer/Moderna 1 dose of [employer])    Answer:   Yes    Order Specific Question:   Has patient completed COVID Booster / 3rd dose    Answer:   Yes    Order Specific Question:   Release to patient    Answer:   Immediate

## 2020-11-22 ENCOUNTER — Telehealth: Payer: Self-pay | Admitting: Medical

## 2020-11-22 LAB — SARS-COV-2, NAA 2 DAY TAT

## 2020-11-22 LAB — NOVEL CORONAVIRUS, NAA: SARS-CoV-2, NAA: NOT DETECTED

## 2020-11-22 NOTE — Telephone Encounter (Signed)
Reviewed with Deborah Stafford that her Covid-19 PCR was negatiave. She currently has no symptoms now.  She is vaccinated and boosted with [employer]. She may return to work but  She will need to wear a mask x 10 days. If any new symptoms occur she is to get tested as soon as possible and she may do so at this clinic. Patient verbalizes understanding and has no further questions at the end of our conversation.    I also reviewed with patient about wearing a mask, standing  6 ft apart and washing hands often, she verbalizes understanding.

## 2021-02-20 ENCOUNTER — Ambulatory Visit: Payer: BC Managed Care – PPO | Admitting: Nurse Practitioner

## 2021-02-20 ENCOUNTER — Other Ambulatory Visit: Payer: Self-pay

## 2021-02-20 VITALS — BP 107/80 | HR 82 | Temp 97.3°F | Wt 142.2 lb

## 2021-02-20 DIAGNOSIS — J029 Acute pharyngitis, unspecified: Secondary | ICD-10-CM

## 2021-02-20 LAB — POCT RAPID STREP A (OFFICE): Rapid Strep A Screen: NEGATIVE

## 2021-02-20 LAB — POC COVID19 BINAXNOW: SARS Coronavirus 2 Ag: NEGATIVE

## 2021-02-20 NOTE — Progress Notes (Signed)
   Subjective:    Patient ID: Deborah Stafford, female    DOB: 12-02-1985, 35 y.o.   MRN: [id number]  HPI  35 year old female presenting with a sore throat to clinic today. She noticed a white spot on her throat that caused her concerned.  She was a close contact to someone known to have COVID and has been tested prior to today's appointment and has so far been negative.  She denies any other symptoms (negative for fever, cough, congestion).   Last night the pain kept her from sleeping.   She has been under increased stress lately is in the process of changing jobs at [employer].   Today's Vitals   02/20/21 1309  BP: 107/80  Pulse: 82  Temp: (!) 97.3 F (36.3 C)  SpO2: 100%  Weight: 142 lb 3.2 oz (64.5 kg)   Body mass index is 24.41 kg/m.    Review of Systems  Constitutional: Negative.   HENT:  Positive for sore throat.   Respiratory: Negative.    Cardiovascular: Negative.   Musculoskeletal: Negative.   Neurological: Negative.     Allergies  Allergen Reactions   Sulfa Antibiotics Other (See Comments)    Fever and redness per pt, nausea, skin hot and painful       Objective:   Physical Exam Constitutional:      Appearance: She is well-developed.  HENT:     Head: Normocephalic.     Right Ear: Tympanic membrane and ear canal normal.     Left Ear: Tympanic membrane and ear canal normal.     Mouth/Throat:     Mouth: Mucous membranes are moist.     Pharynx: Uvula midline.     Tonsils: No tonsillar exudate.      Comments: Ulceration to right upper tonsil without swelling- surrounding skin WNL Neurological:     Mental Status: She is alert.    Recent Results (from the past 2160 hour(s))  POC COVID-19     Status: Normal   Collection Time: 02/20/21  4:13 PM  Result Value Ref Range   SARS Coronavirus 2 Ag Negative Negative  POCT rapid strep A     Status: Normal   Collection Time: 02/20/21  4:14 PM  Result Value Ref Range   Rapid Strep A Screen Negative Negative        Assessment & Plan:   History and exam consistent with ulceration to right tonsil without signs of infection. This may be to underlying viral illness, stress, or diet related.   Advised avoiding acidic foods.  May use advil or tylenol for pain control  Sore throat lozenges- Cepacol or honey   Return to clinic with onset of any new symptoms, if more lesions appear or with other concern.   Immune support through rest/stress reduction encouraged.

## 2021-04-20 ENCOUNTER — Other Ambulatory Visit: Payer: Self-pay

## 2021-04-20 ENCOUNTER — Other Ambulatory Visit: Payer: BC Managed Care – PPO

## 2021-04-20 DIAGNOSIS — Z0000 Encounter for general adult medical examination without abnormal findings: Secondary | ICD-10-CM

## 2021-04-21 LAB — COMPREHENSIVE METABOLIC PANEL
ALT: 10 IU/L (ref 0–32)
AST: 12 IU/L (ref 0–40)
Albumin/Globulin Ratio: 2.7 — ABNORMAL HIGH (ref 1.2–2.2)
Albumin: 4.8 g/dL (ref 3.8–4.8)
Alkaline Phosphatase: 56 IU/L (ref 44–121)
BUN/Creatinine Ratio: 25 — ABNORMAL HIGH (ref 9–23)
BUN: 15 mg/dL (ref 6–20)
Bilirubin Total: 0.6 mg/dL (ref 0.0–1.2)
CO2: 26 mmol/L (ref 20–29)
Calcium: 9.6 mg/dL (ref 8.7–10.2)
Chloride: 104 mmol/L (ref 96–106)
Creatinine, Ser: 0.6 mg/dL (ref 0.57–1.00)
Globulin, Total: 1.8 g/dL (ref 1.5–4.5)
Glucose: 99 mg/dL (ref 65–99)
Potassium: 4.1 mmol/L (ref 3.5–5.2)
Sodium: 141 mmol/L (ref 134–144)
Total Protein: 6.6 g/dL (ref 6.0–8.5)
eGFR: 120 mL/min/{1.73_m2} (ref >59)

## 2021-04-21 LAB — LIPID PANEL
Chol/HDL Ratio: 4.3 ratio (ref 0.0–4.4)
Cholesterol, Total: 182 mg/dL (ref 100–199)
HDL: 42 mg/dL (ref >39)
LDL Chol Calc (NIH): 125 mg/dL — ABNORMAL HIGH (ref 0–99)
Triglycerides: 78 mg/dL (ref 0–149)
VLDL Cholesterol Cal: 15 mg/dL (ref 5–40)

## 2021-04-21 LAB — CBC WITH DIFFERENTIAL/PLATELET
Basophils Absolute: 0 10*3/uL (ref 0.0–0.2)
Basos: 1 %
EOS (ABSOLUTE): 0 10*3/uL (ref 0.0–0.4)
Eos: 1 %
Hematocrit: 40.9 % (ref 34.0–46.6)
Hemoglobin: 13.8 g/dL (ref 11.1–15.9)
Immature Grans (Abs): 0 10*3/uL (ref 0.0–0.1)
Immature Granulocytes: 0 %
Lymphocytes Absolute: 2.1 10*3/uL (ref 0.7–3.1)
Lymphs: 40 %
MCH: 30.1 pg (ref 26.6–33.0)
MCHC: 33.7 g/dL (ref 31.5–35.7)
MCV: 89 fL (ref 79–97)
Monocytes Absolute: 0.3 10*3/uL (ref 0.1–0.9)
Monocytes: 6 %
Neutrophils Absolute: 2.8 10*3/uL (ref 1.4–7.0)
Neutrophils: 52 %
Platelets: 223 10*3/uL (ref 150–450)
RBC: 4.58 x10E6/uL (ref 3.77–5.28)
RDW: 12.1 % (ref 11.7–15.4)
WBC: 5.3 10*3/uL (ref 3.4–10.8)

## 2021-04-21 LAB — TSH: TSH: 1.1 u[IU]/mL (ref 0.450–4.500)

## 2021-04-21 LAB — HIV ANTIBODY (ROUTINE TESTING W REFLEX): HIV Screen 4th Generation wRfx: NONREACTIVE

## 2021-07-31 ENCOUNTER — Ambulatory Visit: Payer: BC Managed Care – PPO | Admitting: Medical

## 2021-07-31 ENCOUNTER — Other Ambulatory Visit: Payer: Self-pay

## 2021-07-31 ENCOUNTER — Encounter: Payer: Self-pay | Admitting: Medical

## 2021-07-31 DIAGNOSIS — Z3490 Encounter for supervision of normal pregnancy, unspecified, unspecified trimester: Secondary | ICD-10-CM

## 2021-07-31 DIAGNOSIS — U071 COVID-19: Secondary | ICD-10-CM

## 2021-07-31 NOTE — Patient Instructions (Signed)
Pregnancy and COVID-19 Pregnant women and women who were recently pregnant are at an increased risk for severe illness from COVID-19. Other conditions, such as being pregnant at an older age or having diabetes or obesity, can further increase the risk of severe illness from COVID-19. This increased risk can last for at least 6 weeks (42 days) following the end of the pregnancy (postpartum). Protect yourself and your baby by: Knowing your risk factors. Ask your health care provider about your specific risk factors. Working with your health care team to protect yourself against all infections, including COVID-19. How does COVID-19 affect me? Although the overall risks are low, when compared to women who are not pregnant, pregnant and recently pregnant women are at an increased risk for severe illness from COVID-19, including illness that results in admission to the intensive care unit (ICU), mechanical ventilation, and death. If you get COVID-19 while pregnant or shortly after your pregnancy, there is an increased risk that you may: Get a respiratory illness that can lead to pneumonia or severe illness. Give birth to your baby before 37 weeks of pregnancy. Have other complications that can affect your pregnancy, such as having a stillbirth. How does COVID-19 affect my care? If you have COVID-19, special precautions will be taken around your pregnancy: You will have to notify the clinic or hospital before a visit. Steps will be taken to protect other people from the virus, including the visit taking place in a room intended for people who have COVID-19. Tests and scans may be done differently before delivery (prenatal care). Your birth plan may change, including what room you will be in and who may be with you during labor and delivery. You may stay longer in the hospital after delivery (postpartum care). COVID-19 will affect where your baby will stay after delivery. Ask about the risks and benefits of  staying in the same room with your baby. Benefits include breastfeeding and mother-newborn bonding. You may need to feed your baby differently. Visitors will be limited after your baby is born. How does COVID-19 affect my baby? It is very rare for a mother with COVID-19 to pass the virus to the unborn baby. After birth, a baby can get the virus if the baby is exposed to it. Ask your health care provider about ways to protect your baby. A physical barrier can also be used, such as an incubator. What can I do to lower my risk? Medicines and vaccines You can receive a COVID-19 vaccine. This may protect you from severe illness. If you have concerns, talk to your health care provider. Get other recommended vaccines, including the flu vaccine and whooping cough (Tdap) vaccine. You may get these vaccines at the same time you receive the COVID-19 vaccine. Ask your health care provider if you can get a 30-day, or longer, supply of your medicines so you can make fewer trips to the pharmacy. If you have received a COVID-19 vaccine, consider enrolling in the v-safe program from the Centers for Disease Control and Prevention (CDC). This program uses an app on your smartphone to provide check-ins and gather information on your health after you receive the vaccine. There is a separate registry for pregnant women. For more information, visit: V-safe tool: https://www.patterson-winters.biz/ V-safe pregnancy registry: https://gomez-solis.com/ Over time, protection from a vaccine can decline. A booster dose improves, or boosts, your body's ability to protect you from illness. The need for a booster dose depends on which COVID-19 vaccine you received and your risk of  getting very sick from the virus. If you are pregnant or were recently pregnant (up to 6 weeks postpartum), you can receive a COVID-19 booster if you completed your initial COVID-19 vaccine or vaccine series. The booster may be any vaccine product available  to you. You do not have to receive the same product as your initial vaccine or vaccine series. If you received a [employer] vaccine, you should get a booster dose at least 2 months after your initial vaccine. If you received a Pfizer or Moderna vaccine, you may need a booster dose at least 6 months after your initial vaccine. Cleaning and personal hygiene If you are pregnant or were recently pregnant, it is very important that you keep taking recommended steps to prevent infection, including: Wear a mask when in public or at work. Avoid people not wearing a mask. Limit contact with other people as much as possible. Wash your hands with soap and water for at least 20 seconds. If soap and water are not available, use alcohol-based hand sanitizer. Keep your distance. Stay more than 6 feet (1.8 m) away from others as much as possible. Avoid crowds. Avoid touching your mouth, face, eyes, or nose before washing your hands. Clean and disinfect objects and surfaces that are frequently touched.  Other things to do Avoid people who might have been exposed to or infected with COVID-19, including people who live with you. Follow guidelines from health officials about when to wear a mask. The CDC says that all fully vaccinated people should still wear masks in some cases, including: In certain places, such as in health care settings, schools, airports, and on public transit. When required by law or by guidelines from businesses or workplaces. Avoid poorly ventilated spaces. Call your health care provider if you have any health concerns. Contact your health care provider right away if you think you have COVID-19. Tell your health care provider that you think you may have a COVID-19 infection and that you are pregnant. Breastfeeding tips Plan with your family and health care team how to feed your baby. Current research shows that the virus may not pass to a baby through breast milk. If you are  breastfeeding, you can receive a COVID-19 vaccine. The vaccines pose no risk for breastfeeding mothers or their babies. Some of the vaccines might create antibodies in breast milk. These antibodies can help to protect your baby. Take precautions if you have or may have COVID-19. Precautions include: Washing your hands with soap and water for at least 20 seconds before feeding your baby. If soap and water are not available, use alcohol-based hand sanitizer. Wearing a mask while feeding your baby. Pumping or expressing breast milk to feed to your baby. If possible, ask someone in your household who is not sick to feed your baby the expressed breast milk. Wash your hands with soap and water for at least 20 seconds before touching pump parts. Wash and disinfect all pump parts after expressing milk. Follow the manufacturer's instructions to clean and disinfect all pump parts. Follow these instructions: Managing stress Some pregnant and postpartum women may have fear, uncertainty, and stress because of COVID-19. Find ways to manage stress. These may include: Using relaxation techniques such as meditation and deep breathing. Getting regular exercise. Most women can continue their usual exercise routine during pregnancy. Ask your health care provider what activities are safe for you. Seeking support from family, friends, or spiritual resources. If you cannot be together in person, you can  still connect by phone calls, texts, video calls, or online messaging. Doing relaxing activities that you enjoy, such as listening to music or reading a good book. General instructions Follow your health care provider's instructions on taking medicines. Some medicines may not be safe to take during pregnancy. Ask for help if you have counseling or nutritional needs. Your health care provider can offer advice or refer you to resources or specialists who can help you with various needs. Keep all follow-up visits. This  includes visits before and after you have your baby. Questions to ask your health care team What should I do if I have COVID-19 symptoms? What are the side effects that can occur after receiving any of the available COVID-19 vaccines? How will COVID-19 affect my prenatal care visits, tests and scans, labor and delivery, and postpartum care? What are the risks of COVID-19 to me and the potential risks to my unborn baby or infant? How do vaccines pass antibodies to my unborn baby? Should I plan to breastfeed my baby? Where can I find mental health resources? Where can I find support if I have financial concerns? Where to find more information CDC: KVTVnet.com.cy World Health Organization [facility]): CommodityPost.es [employer] of [employer]ians [employer]necologists (ACOG): www.acog.org Contact a health care provider if: You have signs and symptoms of infection, including a fever or cough. Tell your health care team that you think you may have a COVID-19 infection and that you are pregnant. You have strong emotions, such as sadness or anxiety. You feel unsafe in your home and need help finding a safe place to live. You have bloody or watery vaginal discharge or vaginal bleeding. Get help right away if: You have signs or symptoms of labor before 37 weeks of pregnancy. These include: Contractions that are 5 minutes or less apart, or that increase in frequency, intensity, or length. Sudden, sharp pain in the abdomen or in the lower back. A gush or trickle of fluid from your vagina. You have signs of more serious illness, such as: Trouble breathing. Chest pain. A fever of 102.75F (39C) or higher that does not go away. Vomiting every time you drink fluids. Feeling extremely weak. Fainting. These symptoms may represent a serious problem that is an emergency. Do not wait to see if the symptoms will go away. Get medical help right away. Call your local  emergency services (911 in the [place] Do not drive yourself to the hospital. Summary Pregnant women and women who were recently pregnant are at an increased risk for severe illness from COVID-19. Take precautions to protect yourself and your baby. Wear a mask. Wash hands often. Avoid touching your mouth, face, eyes, or nose before washing hands. Avoid large groups of people and stay away from people who are sick. If you think you have a COVID-19 infection, contact your health care provider right away. Tell your health care provider that you think you have COVID-19 and that you are pregnant. If you have COVID-19, special precautions may be taken during pregnancy, labor and delivery, and after delivery. This information is not intended to replace advice given to you by your health care provider. Make sure you discuss any questions you have with your health care provider. Document Revised: 03/28/2021 Document Reviewed: 03/28/2021 Elsevier Patient Education  2022 Elsevier Inc. COVID-19: What to Do if You Are Sick If you test positive and are an older adult or someone who is at high risk of getting very sick from COVID-19, treatment may be available.  Contact a healthcare provider right away after a positive test to determine if you are eligible, even if your symptoms are mild right now. You can also visit a Test to Treat location and, if eligible, receive a prescription from a provider. Don't delay: Treatment must be started within the first few days to be effective. If you have a fever, cough, or other symptoms, you might have COVID-19. Most people have mild illness and are able to recover at home. If you are sick: Keep track of your symptoms. If you have an emergency warning sign (including trouble breathing), call 911. Steps to help prevent the spread of COVID-19 if you are sick If you are sick with COVID-19 or think you might have COVID-19, follow the steps below to care for yourself and to help  protect other people in your home and community. Stay home except to get medical care Stay home. Most people with COVID-19 have mild illness and can recover at home without medical care. Do not leave your home, except to get medical care. Do not visit public areas and do not go to places where you are unable to wear a mask. Take care of yourself. Get rest and stay hydrated. Take over-the-counter medicines, such as acetaminophen, to help you feel better. Stay in touch with your doctor. Call before you get medical care. Be sure to get care if you have trouble breathing, or have any other emergency warning signs, or if you think it is an emergency. Avoid public transportation, ride-sharing, or taxis if possible. Get tested If you have symptoms of COVID-19, get tested. While waiting for test results, stay away from others, including staying apart from those living in your household. Get tested as soon as possible after your symptoms start. Treatments may be available for people with COVID-19 who are at risk for becoming very sick. Don't delay: Treatment must be started early to be effective--some treatments must begin within 5 days of your first symptoms. Contact your healthcare provider right away if your test result is positive to determine if you are eligible. Self-tests are one of several options for testing for the virus that causes COVID-19 and may be more convenient than laboratory-based tests and point-of-care tests. Ask your healthcare provider or your local health department if you need help interpreting your test results. You can visit your state, tribal, local, and territorial health department's website to look for the latest local information on testing sites. Separate yourself from other people As much as possible, stay in a specific room and away from other people and pets in your home. If possible, you should use a separate bathroom. If you need to be around other people or animals in or  outside of the home, wear a well-fitting mask. Tell your close contacts that they may have been exposed to COVID-19. An infected person can spread COVID-19 starting 48 hours (or 2 days) before the person has any symptoms or tests positive. By letting your close contacts know they may have been exposed to COVID-19, you are helping to protect everyone. See COVID-19 and Animals if you have questions about pets. If you are diagnosed with COVID-19, someone from the health department may call you. Answer the call to slow the spread. Monitor your symptoms Symptoms of COVID-19 include fever, cough, or other symptoms. Follow care instructions from your healthcare provider and local health department. Your local health authorities may give instructions on checking your symptoms and reporting information. When to seek emergency medical attention Look  for emergency warning signs* for COVID-19. If someone is showing any of these signs, seek emergency medical care immediately: Trouble breathing Persistent pain or pressure in the chest New confusion Inability to wake or stay awake Pale, gray, or blue-colored skin, lips, or nail beds, depending on skin tone *This list is not all possible symptoms. Please call your medical provider for any other symptoms that are severe or concerning to you. Call 911 or call ahead to your local emergency facility: Notify the operator that you are seeking care for someone who has or may have COVID-19. Call ahead before visiting your doctor Call ahead. Many medical visits for routine care are being postponed or done by phone or telemedicine. If you have a medical appointment that cannot be postponed, call your doctor's office, and tell them you have or may have COVID-19. This will help the office protect themselves and other patients. If you are sick, wear a well-fitting mask You should wear a mask if you must be around other people or animals, including pets (even at home). Wear  a mask with the best fit, protection, and comfort for you. You don't need to wear the mask if you are alone. If you can't put on a mask (because of trouble breathing, for example), cover your coughs and sneezes in some other way. Try to stay at least 6 feet away from other people. This will help protect the people around you. Masks should not be placed on young children under age 28 years, anyone who has trouble breathing, or anyone who is not able to remove the mask without help. Cover your coughs and sneezes Cover your mouth and nose with a tissue when you cough or sneeze. Throw away used tissues in a lined trash can. Immediately wash your hands with soap and water for at least 20 seconds. If soap and water are not available, clean your hands with an alcohol-based hand sanitizer that contains at least 60% alcohol. Clean your hands often Wash your hands often with soap and water for at least 20 seconds. This is especially important after blowing your nose, coughing, or sneezing; going to the bathroom; and before eating or preparing food. Use hand sanitizer if soap and water are not available. Use an alcohol-based hand sanitizer with at least 60% alcohol, covering all surfaces of your hands and rubbing them together until they feel dry. Soap and water are the best option, especially if hands are visibly dirty. Avoid touching your eyes, nose, and mouth with unwashed hands. Handwashing Tips Avoid sharing personal household items Do not share dishes, drinking glasses, cups, eating utensils, towels, or bedding with other people in your home. Wash these items thoroughly after using them with soap and water or put in the dishwasher. Clean surfaces in your home regularly Clean and disinfect high-touch surfaces (for example, doorknobs, tables, handles, light switches, and countertops) in your "sick room" and bathroom. In shared spaces, you should clean and disinfect surfaces and items after each use by the  person who is ill. If you are sick and cannot clean, a caregiver or other person should only clean and disinfect the area around you (such as your bedroom and bathroom) on an as needed basis. Your caregiver/other person should wait as long as possible (at least several hours) and wear a mask before entering, cleaning, and disinfecting shared spaces that you use. Clean and disinfect areas that may have blood, stool, or body fluids on them. Use household cleaners and disinfectants. Clean visible dirty  surfaces with household cleaners containing soap or detergent. Then, use a household disinfectant. Use a product from [employer] List N: Disinfectants for Coronavirus (COVID-19). Be sure to follow the instructions on the label to ensure safe and effective use of the product. Many products recommend keeping the surface wet with a disinfectant for a certain period of time (look at "contact time" on the product label). You may also need to wear personal protective equipment, such as gloves, depending on the directions on the product label. Immediately after disinfecting, wash your hands with soap and water for 20 seconds. For completed guidance on cleaning and disinfecting your home, visit Complete Disinfection Guidance. Take steps to improve ventilation at home Improve ventilation (air flow) at home to help prevent from spreading COVID-19 to other people in your household. Clear out COVID-19 virus particles in the air by opening windows, using air filters, and turning on fans in your home. Use this interactive tool to learn how to improve air flow in your home. When you can be around others after being sick with COVID-19 Deciding when you can be around others is different for different situations. Find out when you can safely end home isolation. For any additional questions about your care, contact your healthcare provider or state or local health department. 11/14/2020 Content source: [facility] ([facility]), Division of Viral Diseases This information is not intended to replace advice given to you by your health care provider. Make sure you discuss any questions you have with your health care provider. Document Revised: 05/04/2021 Document Reviewed: 05/04/2021 Elsevier Patient Education  2022 [employer].

## 2021-07-31 NOTE — Progress Notes (Signed)
   Subjective:    Patient ID: Deborah Stafford, female    DOB: 01-03-1986, 35 y.o.   MRN: [id number]  HPI 35 yo female in non acute distress consents to telemedicine appointment.  Exposure Wednesday and then  her friend tested  positive on Saturday.on Friday and another exposure from a work colleague who tested  positive on Sunday. Patient was   Covid-19 negative  x 2 on  POCT tests ,PCR Positive today. Sympstoms include Runny nose  clear, congestion, cough mostly dry ,occasionally cough is productve clear. With symptoms started on Sunday night..  She took a pregnancy test and it was positive yesterday.   Review of Systems  Constitutional:  Negative for chills, fatigue and fever.  HENT:  Positive for congestion and sore throat.   Respiratory:  Positive for cough. Negative for shortness of breath.   Cardiovascular:  Negative for chest pain.  Gastrointestinal:  Negative for abdominal pain, diarrhea, nausea and vomiting.  Genitourinary:  Negative for difficulty urinating.  Musculoskeletal:  Negative for myalgias.  Skin:  Positive for color change.  Allergic/Immunologic: Positive for environmental allergies. Negative for food allergies.  Neurological:  Negative for dizziness, syncope, light-headedness and headaches.  Psychiatric/Behavioral:  Negative for self-injury and suicidal ideas. The patient is not nervous/anxious.       Objective:   Physical Exam  AXOX3 No physical performed due to telemedicine appointment No distress noted Sounds congested, cough noted on phone.          Assessment & Plan:  Covid -19 infection positive by PCR Pregnancy Contact your OB/GYN for more information about Covid-19 and pregnancy. May take Benadryl or  zyrtec for post nasal drip or congestion. Isolate , rest , use OTC Tylenol for fever or if not feeling well per package instructions. Eat foods that slide, avoiding acidic foods like oranges and tomatoes. Return to the clinic as needed.   Patient verbalizes understanding and has no questions at the end of our conversation.

## 2021-08-26 NOTE — L&D Delivery Note (Signed)
Delivery Note  Date of delivery: 04/17/2022 Estimated Date of Delivery: 04/13/22 Patient's last menstrual period was 07/07/2021. EGA: [redacted]w[redacted]d  Delivery Note At 12:30 PM a viable female was delivered via Vaginal, Spontaneous Presentation: Left Occiput Anterior).  APGAR: 9, 10; weight pending.  Placenta status: Spontaneous, Intact.  Cord: 3 vessels with the following complications: None.  Cord pH: n/a  First Stage: Labor onset: unknown - stage 2: 4h70m Augmentation : Cook cath, Pitocin, AROM Analgesia /Anesthesia intrapartum: Epidural AROM at 2109  Odell A Goodridge presented to L&D with gestational thrombocytopenia for IOL. She was augmented with pitocin. Epidural placed for pain relief.   Second Stage: Complete dilation at 0826 Onset of pushing at 0900 FHR second stage Cat I / Cat II Delivery at 1230 on 04/17/2022  She progressed to complete and had a spontaneous vaginal birth of a live female over an episiotomy performed under epidural anesthesia. The fetal head was delivered in OA position with restitution to ROA. No nuchal cord. Anterior then posterior shoulders delivered spontaneously. Baby placed on mom's abdomen and attended to by transition RN. Cord clamped and cut when pulseless by CNM.   Third Stage: Placenta delivered intact with 3VC at 1238 Placenta disposition: Pathology Uterine tone firm / bleeding min IV pitocin given for hemorrhage prophylaxis  Anesthesia: Epidural Episiotomy: Median Lacerations:   Suture Repair: 2.0 vicryl Est. Blood Loss (mL): 350  Complications: Prolonged pushing stage  Mom to postpartum.  Baby to Couplet care / Skin to Skin.  Newborn: Birth Weight: pending  Apgar Scores: 9/10 Feeding planned: Breastfeeding   Cyril Mourning, CNM 04/17/2022 1:13 PM

## 2021-09-21 DIAGNOSIS — O09513 Supervision of elderly primigravida, third trimester: Secondary | ICD-10-CM | POA: Insufficient documentation

## 2021-09-21 LAB — OB RESULTS CONSOLE VARICELLA ZOSTER ANTIBODY, IGG: Varicella: IMMUNE

## 2021-10-04 ENCOUNTER — Ambulatory Visit: Payer: BC Managed Care – PPO

## 2021-10-30 DIAGNOSIS — Q798 Other congenital malformations of musculoskeletal system: Secondary | ICD-10-CM | POA: Insufficient documentation

## 2021-11-01 ENCOUNTER — Other Ambulatory Visit: Payer: Self-pay | Admitting: Certified Nurse Midwife

## 2021-11-01 DIAGNOSIS — Q798 Other congenital malformations of musculoskeletal system: Secondary | ICD-10-CM

## 2021-11-20 ENCOUNTER — Ambulatory Visit: Payer: BC Managed Care – PPO

## 2021-11-20 ENCOUNTER — Other Ambulatory Visit: Payer: Self-pay

## 2021-11-20 ENCOUNTER — Ambulatory Visit: Payer: BC Managed Care – PPO | Attending: Obstetrics and Gynecology

## 2021-11-20 ENCOUNTER — Ambulatory Visit (HOSPITAL_BASED_OUTPATIENT_CLINIC_OR_DEPARTMENT_OTHER): Payer: BC Managed Care – PPO

## 2021-11-20 DIAGNOSIS — Q798 Other congenital malformations of musculoskeletal system: Secondary | ICD-10-CM | POA: Diagnosis present

## 2021-11-20 DIAGNOSIS — O09522 Supervision of elderly multigravida, second trimester: Secondary | ICD-10-CM | POA: Diagnosis not present

## 2021-11-20 DIAGNOSIS — O352XX0 Maternal care for (suspected) hereditary disease in fetus, not applicable or unspecified: Secondary | ICD-10-CM | POA: Diagnosis not present

## 2021-11-20 DIAGNOSIS — O09292 Supervision of pregnancy with other poor reproductive or obstetric history, second trimester: Secondary | ICD-10-CM | POA: Diagnosis not present

## 2021-11-20 DIAGNOSIS — O321XX0 Maternal care for breech presentation, not applicable or unspecified: Secondary | ICD-10-CM | POA: Diagnosis not present

## 2021-11-20 DIAGNOSIS — O43192 Other malformation of placenta, second trimester: Secondary | ICD-10-CM

## 2021-11-20 DIAGNOSIS — Z363 Encounter for antenatal screening for malformations: Secondary | ICD-10-CM | POA: Insufficient documentation

## 2021-11-20 DIAGNOSIS — Z3A19 19 weeks gestation of pregnancy: Secondary | ICD-10-CM | POA: Diagnosis not present

## 2021-11-20 DIAGNOSIS — O288 Other abnormal findings on antenatal screening of mother: Secondary | ICD-10-CM

## 2021-11-20 NOTE — Progress Notes (Signed)
?Deborah Stafford ?Length of Consultation: 40 minutes ? 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  She was present at the visit with her partner.   ? ?We first obtained a detailed family history and pregnancy history.  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  Other features of this condition may include sternal, rib or clavicle anomalies, hemivertebra and pectus excavatum or carinatum.  Occasionally, individuals with [place] sequence may have hemivertebrae, renal anomalies, Sprengel anomaly, Klippel-Feil sequence, Moebius sequence or dextrocardia. [place] sequence occurs three times more often in males than females and 75% of the time is present on the right side.  The condition is thought to be the result of a defect in the development of the proximal subclavian artery which decreases blood flow to this area during early development. Most cases of [place] sequence are sporadic, though studies have shown a 4.2% recurrence risk in the offspring of affected individuals.  There are some rare familial cases that may show dominant, recessive or X-linked inheritance. Second trimester anatomy ultrasound could be utilized to look for limb anomalies or other structural differences but may not always detect this condition prenatally.  The main concern expressed by this patient was a question of being able to breast-feed.  Provided she has normal development of the breast on the left side, it is likely that she would be able to pursue breast-feeding.  We  have requested a consultation with the lactation consultants prior to delivery to help Deborah Stafford explore this possibility in more detail. ?Mental health diagnoses- The patient stated that her youngest brother has been given several different mental health diagnoses including bipolar and schizophrenia as well as autism spectrum disorder through the years. Because there has been a good bit of uncertainty about his diagnosis, it is hard to determine if the mental health and developmental differences are two separate findings or part of the same underlying condition. She also described a paternal aunt with mild developmental differences and a paternal grandmother with significant mental health concerns who passed away due to complications related to mental health therapies many years ago.  Deborah Stafford, her husband, also reported that his mother has been diagnosed with depression.  We discussed that mental health conditions often present in various ways in different family members and are most often thought to be the results of a combination of inherited as well as other factors referred to as multifactorial inheritance.  There clearly very strong genetic components in many families with pedigrees that would suggest dominant inheritance, however, we do not currently have testing available for specific genes which may be responsible.  Similarly, autism spectrum disorder or mild developmental differences are often thought to be the result of multiple genetic and other factors.  Without a known underlying genetic diagnosis, predictive testing is not possible. ?Jewish ancestry-Deborah Stafford reported that he is of Jewish ancestry. Deborah Stafford is of European Caucasian background but has no known Jewish ancestry. We discussed the option of routine carrier screening for recessive genetic conditions in the Caucasian population in general, which includes screening for cystic fibrosis  and spinal muscular atrophy. The additional option of Jewish  carrier screening is based on the fact that there are several different inherited conditions which occur more frequently in individuals of Jewish descent.  Genetic testing is available for some of the more common conditions, including Canavan Stafford, Cystic Fibrosis, Deborah Stafford, Gaucher Stafford, Neimann-Pick, Fanconi Type C, Bloom syndrome, Familial Dysautonomia and Mucolipidosis Type IV.  All of these conditions are recessive, meaning that both parents must be carriers of the same condition for their children to be at risk.  Carrier screening can detect many parents who may be carriers and if both parents are carriers, then prenatal diagnosis can be made available.  The couple declined carrier screening. ?Miscarriages-  The couple had two first trimester miscarriages prior to this pregnancy, at 5 weeks and 8 weeks.  We reviewed that there may be multiple causes for recurrent pregnancy loss including chromosome rearrangements, acquired or inherited clotting disorders or structural uterine anomalies.  However, when a couple has experienced fewer than 3 losses it is much less likely that we will identify an underlying cause.  Of note, Deborah Stafford reported that his sister has experienced 2 first trimester losses as well and had a 20-week pregnancy termination due to multiple anomalies.  No details are known about the type of anomalies or any genetic testing in that pregnancy.  Given the fact that the current pregnancy is progressing normally, the couple declined any follow-up testing for the losses.  They were encouraged to reach out to our clinic if any more is learned about his sisters pregnancy loss. ? ?The remainder of the family history was reported to be unremarkable for birth defects, intellectual delays, recurrent pregnancy loss or known chromosome abnormalities. ? ?Prior testing in this pregnancy for chromosome conditions was performed and was negative ([id number]) and msAFP screening for open neural tube  defects was also negative. ? ?Ms. Allebach was encouraged to call with questions or concerns.  We can be contacted at [phone]. ? ?Plan of care: ?Ultrasound at the time of this visit showed normal fetal anatomy at [redacted] weeks gestation.  A follow up visit was scheduled in 9 weeks. See that report for details. ?Referral was placed for a consultation with the [facility], who will reach out to the patient for a visit as needed. ?The couple declined routine carrier screening as well as expanded screening due to [place] ancestry. ? ? ?Deborah Anderson, MS, CGC ? ?

## 2021-11-26 ENCOUNTER — Telehealth: Payer: Self-pay

## 2021-11-26 NOTE — Telephone Encounter (Signed)
Lactation Note: ? ?[facility](Deborah Wells and Roxan Diesel). 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 Her expected due date is 04/13/22. This is her first baby. 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 She has questions regarding the possibility to breastfeed and will she be able to produce enough milk for her baby with one breast. She will have a breastpump via her covered insurance benefit and will order that in her third trimester. ? ?Called mom by phone and offered her an in-person or a phone call consult. At this time mom preferred a phone call. Reviewed with mom how the body knows to make milk and the potential for her to produce a full supply to breastfeed and sustain her baby with one breast. Also, discussed the additional factors that can influence milk production and the importance of consistent stimulation and emptying of the breast especially to establish milk in the early post partum period. Discussed LC's and care nurse will assist and support her after delivery. Informed mom of continued outpatient lactation support available at [facility]. ?Mom verbalized understanding of information provided. Mom has LC office number if she has any additional questions. ? ?Twenty minutes spent via phone call with patient. ? ?Lonzo Cloud RN, IBCLC  ?

## 2022-01-17 ENCOUNTER — Other Ambulatory Visit: Payer: Self-pay

## 2022-01-17 DIAGNOSIS — O09523 Supervision of elderly multigravida, third trimester: Secondary | ICD-10-CM

## 2022-01-17 DIAGNOSIS — Q798 Other congenital malformations of musculoskeletal system: Secondary | ICD-10-CM

## 2022-01-22 ENCOUNTER — Other Ambulatory Visit: Payer: Self-pay

## 2022-01-22 ENCOUNTER — Ambulatory Visit: Payer: BC Managed Care – PPO | Attending: Obstetrics

## 2022-01-22 DIAGNOSIS — Z3A28 28 weeks gestation of pregnancy: Secondary | ICD-10-CM | POA: Diagnosis not present

## 2022-01-22 DIAGNOSIS — O43193 Other malformation of placenta, third trimester: Secondary | ICD-10-CM | POA: Diagnosis not present

## 2022-01-22 DIAGNOSIS — O09523 Supervision of elderly multigravida, third trimester: Secondary | ICD-10-CM | POA: Diagnosis not present

## 2022-01-22 DIAGNOSIS — Q798 Other congenital malformations of musculoskeletal system: Secondary | ICD-10-CM | POA: Diagnosis present

## 2022-01-22 DIAGNOSIS — O352XX0 Maternal care for (suspected) hereditary disease in fetus, not applicable or unspecified: Secondary | ICD-10-CM

## 2022-01-24 DIAGNOSIS — O99013 Anemia complicating pregnancy, third trimester: Secondary | ICD-10-CM | POA: Diagnosis present

## 2022-01-24 DIAGNOSIS — D509 Iron deficiency anemia, unspecified: Secondary | ICD-10-CM | POA: Diagnosis present

## 2022-03-05 ENCOUNTER — Other Ambulatory Visit: Payer: Self-pay

## 2022-03-05 ENCOUNTER — Ambulatory Visit: Payer: BC Managed Care – PPO

## 2022-03-05 DIAGNOSIS — Q798 Other congenital malformations of musculoskeletal system: Secondary | ICD-10-CM

## 2022-03-05 DIAGNOSIS — O09523 Supervision of elderly multigravida, third trimester: Secondary | ICD-10-CM

## 2022-03-07 ENCOUNTER — Other Ambulatory Visit: Payer: Self-pay

## 2022-03-07 ENCOUNTER — Ambulatory Visit: Payer: BC Managed Care – PPO | Attending: Maternal & Fetal Medicine

## 2022-03-07 DIAGNOSIS — Z3A00 Weeks of gestation of pregnancy not specified: Secondary | ICD-10-CM | POA: Insufficient documentation

## 2022-03-07 DIAGNOSIS — Q798 Other congenital malformations of musculoskeletal system: Secondary | ICD-10-CM | POA: Diagnosis not present

## 2022-03-07 DIAGNOSIS — O09523 Supervision of elderly multigravida, third trimester: Secondary | ICD-10-CM | POA: Insufficient documentation

## 2022-03-07 DIAGNOSIS — O99413 Diseases of the circulatory system complicating pregnancy, third trimester: Secondary | ICD-10-CM | POA: Insufficient documentation

## 2022-03-07 DIAGNOSIS — Z3A34 34 weeks gestation of pregnancy: Secondary | ICD-10-CM | POA: Diagnosis not present

## 2022-03-07 DIAGNOSIS — O43193 Other malformation of placenta, third trimester: Secondary | ICD-10-CM

## 2022-03-07 DIAGNOSIS — O352XX0 Maternal care for (suspected) hereditary disease in fetus, not applicable or unspecified: Secondary | ICD-10-CM

## 2022-03-20 LAB — OB RESULTS CONSOLE GC/CHLAMYDIA
Chlamydia: NEGATIVE
Neisseria Gonorrhea: NEGATIVE

## 2022-03-20 LAB — OB RESULTS CONSOLE RPR: RPR: NONREACTIVE

## 2022-03-20 LAB — OB RESULTS CONSOLE GBS: GBS: NEGATIVE

## 2022-03-20 LAB — OB RESULTS CONSOLE HIV ANTIBODY (ROUTINE TESTING): HIV: NONREACTIVE

## 2022-04-11 ENCOUNTER — Other Ambulatory Visit: Payer: Self-pay | Admitting: Obstetrics and Gynecology

## 2022-04-11 DIAGNOSIS — O480 Post-term pregnancy: Secondary | ICD-10-CM

## 2022-04-11 NOTE — Progress Notes (Signed)
Dating: EDD: 04/13/22  by LMP: 07/07/21 and c/w [place] at 8.2 wks.   Preg c/b: 1. Maternal Age >36 yo 2. H/o mental health diagnoses, ADHD: no meds 3. 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 H/o SABs x2 5. TSH 0.138 with NOB labs (1/27) - normal for 1st trimester Could repeat with next blood draw 02/21/22: TSH 1.449 (normal) 6. Marginal Cord Insertion: Getting monthly growth [place] with MFM 7. COVID during 1st tri pregnancy 8. Thrombocytopenia: Platelet count 135 at 36 week labs 04/04/22: platelets 124  Prenatal Labs: Blood type/Rh A Pos  Antibody screen neg  Rubella Immune  Varicella Immune  RPR NR  HBsAg Neg  HIV NR  GC neg  Chlamydia neg  Genetic screening negative  1 hour GTT 133  3 hour GTT   GBS Neg    Contraception: Liletta IUD Infant feeding: breast Tdap: 02/06/22 Flu: n/a

## 2022-04-16 ENCOUNTER — Encounter: Payer: Self-pay | Admitting: Obstetrics and Gynecology

## 2022-04-16 ENCOUNTER — Inpatient Hospital Stay: Payer: BC Managed Care – PPO | Admitting: General Practice

## 2022-04-16 ENCOUNTER — Inpatient Hospital Stay
Admission: EM | Admit: 2022-04-16 | Discharge: 2022-04-18 | DRG: 807 | Disposition: A | Discharge status: Home / Self Care | Payer: BC Managed Care – PPO | Attending: Obstetrics | Admitting: Obstetrics

## 2022-04-16 ENCOUNTER — Other Ambulatory Visit: Payer: Self-pay

## 2022-04-16 DIAGNOSIS — F909 Attention-deficit hyperactivity disorder, unspecified type: Secondary | ICD-10-CM | POA: Diagnosis present

## 2022-04-16 DIAGNOSIS — D509 Iron deficiency anemia, unspecified: Secondary | ICD-10-CM | POA: Diagnosis present

## 2022-04-16 DIAGNOSIS — O99113 Other diseases of the blood and blood-forming organs and certain disorders involving the immune mechanism complicating pregnancy, third trimester: Secondary | ICD-10-CM | POA: Diagnosis present

## 2022-04-16 DIAGNOSIS — D696 Thrombocytopenia, unspecified: Secondary | ICD-10-CM | POA: Diagnosis present

## 2022-04-16 DIAGNOSIS — O99344 Other mental disorders complicating childbirth: Secondary | ICD-10-CM | POA: Diagnosis present

## 2022-04-16 DIAGNOSIS — Q798 Other congenital malformations of musculoskeletal system: Secondary | ICD-10-CM | POA: Diagnosis not present

## 2022-04-16 DIAGNOSIS — D6959 Other secondary thrombocytopenia: Secondary | ICD-10-CM | POA: Diagnosis present

## 2022-04-16 DIAGNOSIS — O99892 Other specified diseases and conditions complicating childbirth: Secondary | ICD-10-CM | POA: Diagnosis present

## 2022-04-16 DIAGNOSIS — O480 Post-term pregnancy: Secondary | ICD-10-CM | POA: Diagnosis present

## 2022-04-16 DIAGNOSIS — Z3A40 40 weeks gestation of pregnancy: Secondary | ICD-10-CM | POA: Diagnosis not present

## 2022-04-16 DIAGNOSIS — O99013 Anemia complicating pregnancy, third trimester: Secondary | ICD-10-CM | POA: Diagnosis present

## 2022-04-16 DIAGNOSIS — Z8616 Personal history of COVID-19: Secondary | ICD-10-CM

## 2022-04-16 DIAGNOSIS — O9912 Other diseases of the blood and blood-forming organs and certain disorders involving the immune mechanism complicating childbirth: Secondary | ICD-10-CM | POA: Diagnosis present

## 2022-04-16 DIAGNOSIS — O9902 Anemia complicating childbirth: Secondary | ICD-10-CM | POA: Diagnosis present

## 2022-04-16 LAB — COMPREHENSIVE METABOLIC PANEL
ALT: 13 U/L (ref 0–44)
AST: 20 U/L (ref 15–41)
Albumin: 3.5 g/dL (ref 3.5–5.0)
Alkaline Phosphatase: 126 U/L (ref 38–126)
Anion gap: 8 (ref 5–15)
BUN: 7 mg/dL (ref 6–20)
CO2: 20 mmol/L — ABNORMAL LOW (ref 22–32)
Calcium: 9.8 mg/dL (ref 8.9–10.3)
Chloride: 108 mmol/L (ref 98–111)
Creatinine, Ser: 0.46 mg/dL (ref 0.44–1.00)
GFR, Estimated: >60 mL/min (ref >60)
Glucose, Bld: 95 mg/dL (ref 70–99)
Potassium: 3.7 mmol/L (ref 3.5–5.1)
Sodium: 136 mmol/L (ref 135–145)
Total Bilirubin: 0.6 mg/dL (ref 0.3–1.2)
Total Protein: 6.7 g/dL (ref 6.5–8.1)

## 2022-04-16 LAB — TYPE AND SCREEN
ABO/RH(D): A POS
Antibody Screen: NEGATIVE

## 2022-04-16 LAB — CBC
HCT: 36.7 % (ref 36.0–46.0)
Hemoglobin: 12.6 g/dL (ref 12.0–15.0)
MCH: 30.8 pg (ref 26.0–34.0)
MCHC: 34.3 g/dL (ref 30.0–36.0)
MCV: 89.7 fL (ref 80.0–100.0)
Platelets: 145 10*3/uL — ABNORMAL LOW (ref 150–400)
RBC: 4.09 MIL/uL (ref 3.87–5.11)
RDW: 12.9 % (ref 11.5–15.5)
WBC: 9.6 10*3/uL (ref 4.0–10.5)
nRBC: 0 % (ref 0.0–0.2)

## 2022-04-16 LAB — PROTEIN / CREATININE RATIO, URINE
Creatinine, Urine: 70 mg/dL
Protein Creatinine Ratio: 0.13 mg/mg{Cre} (ref 0.00–0.15)
Total Protein, Urine: 9 mg/dL

## 2022-04-16 MED ORDER — LACTATED RINGERS IV SOLN: INTRAVENOUS | Status: DC

## 2022-04-16 MED ORDER — OXYTOCIN 10 UNIT/ML IJ SOLN
INTRAMUSCULAR | Status: AC
  Filled 2022-04-16: qty 2

## 2022-04-16 MED ORDER — MISOPROSTOL 25 MCG QUARTER TABLET: 25.0000 ug | ORAL_TABLET | Freq: Once | ORAL | Status: DC

## 2022-04-16 MED ORDER — OXYTOCIN-SODIUM CHLORIDE 30-0.9 UT/500ML-% IV SOLN
1.0000 m[IU]/min | INTRAVENOUS | Status: DC
  Administered 2022-04-16 – 2022-04-17 (×2): 2 m[IU]/min via INTRAVENOUS
  Filled 2022-04-16: qty 500

## 2022-04-16 MED ORDER — LACTATED RINGERS IV SOLN
500.0000 mL | INTRAVENOUS | Status: DC | PRN
  Administered 2022-04-16: 500 mL via INTRAVENOUS

## 2022-04-16 MED ORDER — DIPHENHYDRAMINE HCL 50 MG/ML IJ SOLN: 12.5000 mg | INTRAMUSCULAR | Status: DC | PRN

## 2022-04-16 MED ORDER — AMMONIA AROMATIC IN INHA
RESPIRATORY_TRACT | Status: AC
  Filled 2022-04-16: qty 10

## 2022-04-16 MED ORDER — ACETAMINOPHEN 325 MG PO TABS: 650.0000 mg | ORAL_TABLET | ORAL | Status: DC | PRN

## 2022-04-16 MED ORDER — FENTANYL CITRATE (PF) 100 MCG/2ML IJ SOLN
50.0000 ug | INTRAMUSCULAR | Status: DC | PRN
  Administered 2022-04-16 (×2): 50 ug via INTRAVENOUS
  Filled 2022-04-16: qty 2

## 2022-04-16 MED ORDER — ONDANSETRON HCL 4 MG/2ML IJ SOLN
4.0000 mg | Freq: Four times a day (QID) | INTRAMUSCULAR | Status: DC | PRN
  Administered 2022-04-17: 4 mg via INTRAVENOUS
  Filled 2022-04-16: qty 2

## 2022-04-16 MED ORDER — EPHEDRINE 5 MG/ML INJ
10.0000 mg | INTRAVENOUS | Status: DC | PRN
  Administered 2022-04-16: 10 mg via INTRAVENOUS
  Filled 2022-04-16: qty 5

## 2022-04-16 MED ORDER — LIDOCAINE HCL (PF) 1 % IJ SOLN: 30.0000 mL | INTRAMUSCULAR | Status: DC | PRN

## 2022-04-16 MED ORDER — TERBUTALINE SULFATE 1 MG/ML IJ SOLN: 0.2500 mg | Freq: Once | INTRAMUSCULAR | Status: DC | PRN

## 2022-04-16 MED ORDER — MISOPROSTOL 200 MCG PO TABS
ORAL_TABLET | ORAL | Status: AC
  Filled 2022-04-16: qty 4

## 2022-04-16 MED ORDER — OXYTOCIN-SODIUM CHLORIDE 30-0.9 UT/500ML-% IV SOLN
2.5000 [IU]/h | INTRAVENOUS | Status: DC
  Administered 2022-04-17: 2.5 [IU]/h via INTRAVENOUS

## 2022-04-16 MED ORDER — FENTANYL-BUPIVACAINE-NACL 0.5-0.125-0.9 MG/250ML-% EP SOLN
12.0000 mL/h | EPIDURAL | Status: DC | PRN
  Administered 2022-04-16 – 2022-04-17 (×2): 12 mL/h via EPIDURAL
  Filled 2022-04-16 (×2): qty 250

## 2022-04-16 MED ORDER — LACTATED RINGERS IV SOLN
500.0000 mL | Freq: Once | INTRAVENOUS | Status: AC
  Administered 2022-04-16: 500 mL via INTRAVENOUS

## 2022-04-16 MED ORDER — SOD CITRATE-CITRIC ACID 500-334 MG/5ML PO SOLN: 30.0000 mL | ORAL | Status: DC | PRN

## 2022-04-16 MED ORDER — PHENYLEPHRINE 80 MCG/ML (10ML) SYRINGE FOR IV PUSH (FOR BLOOD PRESSURE SUPPORT)
80.0000 ug | PREFILLED_SYRINGE | INTRAVENOUS | Status: DC | PRN
  Administered 2022-04-16 (×2): 80 ug via INTRAVENOUS
  Filled 2022-04-16: qty 10

## 2022-04-16 MED ORDER — OXYTOCIN BOLUS FROM INFUSION
333.0000 mL | Freq: Once | INTRAVENOUS | Status: AC
  Administered 2022-04-17: 333 mL via INTRAVENOUS

## 2022-04-16 MED ORDER — LIDOCAINE HCL (PF) 1 % IJ SOLN
INTRAMUSCULAR | Status: AC
  Filled 2022-04-16: qty 30

## 2022-04-16 MED ORDER — EPHEDRINE 5 MG/ML INJ
10.0000 mg | INTRAVENOUS | Status: DC | PRN
  Administered 2022-04-16: 10 mg via INTRAVENOUS

## 2022-04-16 MED ORDER — PHENYLEPHRINE 80 MCG/ML (10ML) SYRINGE FOR IV PUSH (FOR BLOOD PRESSURE SUPPORT)
80.0000 ug | PREFILLED_SYRINGE | INTRAVENOUS | Status: DC | PRN
  Administered 2022-04-16 (×2): 80 ug via INTRAVENOUS

## 2022-04-16 NOTE — Anesthesia Procedure Notes (Signed)
Epidural Patient location during procedure: OB Start time: 04/16/2022 9:35 PM End time: 04/16/2022 9:59 PM  Staffing Anesthesiologist: Stephanie Coup, MD Performed: anesthesiologist   Preanesthetic Checklist Completed: patient identified, IV checked, site marked, risks and benefits discussed, surgical consent, monitors and equipment checked, pre-op evaluation and timeout performed  Epidural Patient position: sitting Prep: Betadine Patient monitoring: heart rate, continuous pulse ox and blood pressure Approach: midline Location: L3-L4 Injection technique: LOR saline  Needle:  Needle type: Tuohy  Needle gauge: 18 G Needle length: 9 cm and 9 Needle insertion depth: 4 cm Catheter type: closed end flexible Catheter size: 20 Guage Catheter at skin depth: 9 cm Test dose: negative and 1.5% lidocaine with Epi 1:200 K  Assessment Sensory level: T4 Events: blood not aspirated, injection not painful, no injection resistance, no paresthesia and negative IV test  Additional Notes   Patient tolerated the insertion well without complications.Reason for block:procedure for pain

## 2022-04-16 NOTE — Progress Notes (Addendum)
Labor Check  Subj:  Complaints: feeling pain with contractions   Obj:  BP 127/60 (BP Location: Right Arm)   Pulse 87   Temp 98.2 F (36.8 C) (Oral)   Resp 18   Ht 5\' 4"  (1.626 m)   Wt 77.6 kg   LMP 07/07/2021   BMI 29.35 kg/m  Dose (milli-units/min) Oxytocin: 6 milli-units/min  Cervix: Dilation: 7 / Effacement (%): 80 / Station: -1  Baseline FHR: 135 beats/min   Variability: moderate   Accelerations: present   Decelerations: absent Contractions: present frequency: 1-4 Overall assessment: reassuring  Female chaperone present for pelvic exam:   A/P: 36 y.o. G80P0020 female at [redacted]w[redacted]d with gestational thrombocytopenia.  1.  Labor: progressing normally Cooks cervical cath in x 3 hrs out with a tug,will continue titration of Pitocin as appropriate. AROM for clear fluid at 2106  2.  FWB: cat 1, Overall assessment: category 1  3.  GBS neg  4.  Pain: 8/10 with contractions 5.  Recheck: 7/80/-1  6. Anesthesia notified for epidural placement  [address]  04/16/2022 9:14 PM

## 2022-04-16 NOTE — H&P (Signed)
OB History & Physical   History of Present Illness:   Chief Complaint: IOL for gestational thrombocytopenia  HPI:  Deborah Stafford is a 36 y.o. G71P0020 female at [redacted]w[redacted]d, Patient's last menstrual period was 07/07/2021., consistent with [place] at [redacted]w[redacted]d, with Estimated Date of Delivery: 04/13/22.  She presents to L&D for IOL for Gestational Thrombocytopenia  Reports active fetal movement  Contractions: every 2 to 5 minutes LOF/SROM: intact Vaginal bleeding: none  Factors complicating pregnancy:  AMA ADHD [place] syndrome H/o SAB x2 Covid during pregnancy thrombocytopenia  Patient Active Problem List   Diagnosis Date Noted   Gestational thrombocytopenia without hemorrhage in third trimester (HCC) 04/16/2022   Maternal iron deficiency anemia complicating pregnancy in third trimester 01/24/2022   [place] syndrome 10/30/2021   Encounter for supervision of primigravida of advanced maternal age in third trimester 09/21/2021   Pain in limb 01/13/2013   Swelling of limb 01/13/2013   Varicose veins of right lower extremity with complications 01/13/2013    Prenatal Transfer Tool  Maternal Diabetes: No Genetic Screening: Normal Maternal Ultrasounds/Referrals: Normal Fetal Ultrasounds or other Referrals:  Referred to Materal Fetal Medicine  Maternal Substance Abuse:  No Significant Maternal Medications:  None Significant Maternal Lab Results: None  Maternal Medical History:   Past Medical History:  Diagnosis Date   Irregular heartbeat    Varicose veins of leg with edema, right     Past Surgical History:  Procedure Laterality Date   BREAST RECONSTRUCTION Right 2001   ENDOVENOUS ABLATION SAPHENOUS VEIN W/ LASER Right 01/13/2018   endovenous laser ablation right greater saphenous vein and stab phlebectomy > 20 incisions right leg by Josephina Gip MD    HAND SURGERY     Web fingering   HYSTEROSCOPY WITH D & C N/A 01/28/2020   Procedure: DILATATION AND CURETTAGE /HYSTEROSCOPY;   Surgeon: Christeen Douglas, MD;  Location: [facility] ORS;  Service: Gynecology;  Laterality: N/A;    Allergies  Allergen Reactions   Bee Pollen    Sulfa Antibiotics Other (See Comments)    Fever and redness per pt, nausea, skin hot and painful    Prior to Admission medications   Medication Sig Start Date End Date Taking? Authorizing Provider  acetaminophen (TYLENOL) 325 MG tablet Take 650 mg by mouth every 6 (six) hours as needed.   Yes [provider]  ferrous sulfate 325 (65 FE) MG tablet Take 325 mg by mouth daily with breakfast.   Yes [provider]  prenatal vitamin w/FE, FA (PRENATAL 1 + 1) 27-1 MG TABS tablet Take 1 tablet by mouth daily at 12 noon.   Yes [provider]  amphetamine-dextroamphetamine (ADDERALL) 10 MG tablet Take 10 mg by mouth daily with breakfast.    [provider]     Prenatal care site:  [facility]  OB History  Gravida Para Term Preterm AB Living  3 0 0 0 2 0  SAB IAB Ectopic Multiple Live Births  2 0 0 0 0    # Outcome Date GA Lbr Len/2nd Weight Sex Delivery Anes PTL Lv  3 Current           2 SAB           1 SAB              Social History: She  reports that she has never smoked. She has never used smokeless tobacco. She reports that she does not currently use alcohol. She reports that she does not use drugs.  Family History: family history includes Arrhythmia in her father; Cancer in her maternal grandfather and paternal grandfather; Diabetes in her maternal grandfather; Heart disease in her father; Hyperlipidemia in her father; Hypertension in her mother.   Review of Systems: A full review of systems was performed and negative except as noted in the HPI.     Physical Exam:  Vital Signs: BP 108/65   Pulse 88   Temp 98.4 F (36.9 C) (Oral)   Resp 14   Ht 5\' 4"  (1.626 m)   Wt 77.6 kg   LMP 07/07/2021   BMI 29.35 kg/m   General: no acute distress.  HEENT: normocephalic, atraumatic Heart:  regular rate & rhythm Lungs: normal respiratory effort Abdomen: soft, gravid, non-tender;  EFW: 7lbs Pelvic:   External: Normal external female genitalia  Cervix:   /   /      Extremities: non-tender, symmetric, no edema bilaterally.  DTRs: +2  Neurologic: Alert & oriented x 3.    Results for orders placed or performed during the hospital encounter of 04/16/22 (from the past 24 hour(s))  CBC     Status: Abnormal   Collection Time: 04/16/22  2:56 PM  Result Value Ref Range   WBC 9.6 4.0 - 10.5 K/uL   RBC 4.09 3.87 - 5.11 MIL/uL   Hemoglobin 12.6 12.0 - 15.0 g/dL   HCT 04/18/22 50.5 - 39.7 %   MCV 89.7 80.0 - 100.0 fL   MCH 30.8 26.0 - 34.0 pg   MCHC 34.3 30.0 - 36.0 g/dL   RDW 67.3 41.9 - 37.9 %   Platelets 145 (L) 150 - 400 K/uL   nRBC 0.0 0.0 - 0.2 %  Type and screen     Status: None   Collection Time: 04/16/22  2:56 PM  Result Value Ref Range   ABO/RH(D) A POS    Antibody Screen NEG    Sample Expiration      04/19/2022,2359 Performed at [facility], [address] [place], [place]   Comprehensive metabolic panel     Status: Abnormal   Collection Time: 04/16/22  2:56 PM  Result Value Ref Range   Sodium 136 135 - 145 mmol/L   Potassium 3.7 3.5 - 5.1 mmol/L   Chloride 108 98 - 111 mmol/L   CO2 20 (L) 22 - 32 mmol/L   Glucose, Bld 95 70 - 99 mg/dL   BUN 7 6 - 20 mg/dL   Creatinine, Ser 04/18/22 0.44 - 1.00 mg/dL   Calcium 9.8 8.9 - 3.29 mg/dL   Total Protein 6.7 6.5 - 8.1 g/dL   Albumin 3.5 3.5 - 5.0 g/dL   AST 20 15 - 41 U/L   ALT 13 0 - 44 U/L   Alkaline Phosphatase 126 38 - 126 U/L   Total Bilirubin 0.6 0.3 - 1.2 mg/dL   GFR, Estimated 92.4 >26 mL/min   Anion gap 8 5 - 15  Protein / creatinine ratio, urine     Status: None   Collection Time: 04/16/22  2:56 PM  Result Value Ref Range   Creatinine, Urine 70 mg/dL   Total Protein, Urine 9 mg/dL   Protein Creatinine Ratio 0.13 0.00 - 0.15 mg/mg[Cre]    Pertinent Results:  Prenatal Labs: Blood  type/Rh A POS   Antibody screen Negative    Rubella immune    Varicella Immune  RPR   NR  HBsAg  Neg  Hep C NR   HIV Non Reactive (08/26 0850)  GC neg  Chlamydia neg  Genetic screening cfDNA negative   1 hour GTT 133  3 hour GTT N/a  GBS   neg   FHT:  FHR: 125 bpm, variability: moderate,  accelerations:  Present,  decelerations:  Absent Category/reactivity:  Category I UC:   irregular, every 2-5 minutes   Cephalic by Leopolds and SVE   No results found.  Assessment:  Dinesha Twiggs Monestime is a 36 y.o. G16P0020 female at [redacted]w[redacted]d with Gestational thrombocytopenia.   Plan:  1. Admit to Labor & Delivery - consents reviewed and obtained -labs obtained -Dr Feliberto Gottron aware of admission  2. Fetal Well being  - Fetal Tracing: cat 1 - Group B Streptococcus ppx not indicated: GBS negative - Presentation: cephalic confirmed by sve   3. Routine OB: - Prenatal labs reviewed, as above - Rh positive - CBC, T&S, RPR on admit - Clear liquid diet , continuous IV fluids  4. Induction of labor  - Contractions monitored with external toco - Pelvis  adequate for trial of labor  - Plan for induction with oxytocin and cervical balloon  - Augmentation with oxytocin and AROM as appropriate  - Plan for  continuous fetal monitoring - Maternal pain control as desired; planning regional anesthesia - Anticipate vaginal delivery  5. Post Partum Planning: - Infant feeding: breast feeding - Contraception: IUD - Tdap vaccine: Given prenatally - Flu vaccine:  not in seasopn  Tanveer Dobberstein Wonda Amis, CNM 04/16/22 5:28 PM  Chari Manning , CNM Certified Nurse Midwife [place]  [facility]

## 2022-04-16 NOTE — Anesthesia Preprocedure Evaluation (Signed)
Anesthesia Evaluation  Patient identified by MRN, date of birth, ID band Patient awake    Reviewed: Allergy & Precautions, NPO status , Patient's Chart, lab work & pertinent test results  Airway Mallampati: III  TM Distance: >3 FB Neck ROM: full    Dental  (+) Teeth Intact, Dental Advidsory Given   Pulmonary neg pulmonary ROS, neg shortness of breath, neg recent URI,    Pulmonary exam normal        Cardiovascular Exercise Tolerance: Good (-) Past MI and (-) CABG negative cardio ROS Normal cardiovascular exam     Neuro/Psych    GI/Hepatic negative GI ROS,   Endo/Other    Renal/GU   negative genitourinary   Musculoskeletal   Abdominal   Peds  Hematology negative hematology ROS (+)   Anesthesia Other Findings Past Medical History: No date: Irregular heartbeat No date: Varicose veins of leg with edema, right  Past Surgical History: 2001: BREAST RECONSTRUCTION; Right 01/13/2018: ENDOVENOUS ABLATION SAPHENOUS VEIN W/ LASER; Right     Comment:  endovenous laser ablation right greater saphenous vein               and stab phlebectomy > 20 incisions right leg by Josephina Gip MD  No date: HAND SURGERY     Comment:  Web fingering 01/28/2020: HYSTEROSCOPY WITH D & C; N/A     Comment:  Procedure: DILATATION AND CURETTAGE /HYSTEROSCOPY;                Surgeon: Christeen Douglas, MD;  Location: [facility] ORS;                Service: Gynecology;  Laterality: N/A;  BMI    Body Mass Index: 29.35 kg/m      Reproductive/Obstetrics (+) Pregnancy                             Anesthesia Physical Anesthesia Plan  ASA: 2  Anesthesia Plan: Epidural   Post-op Pain Management:    Induction:   PONV Risk Score and Plan:   Airway Management Planned: Natural Airway  Additional Equipment:   Intra-op Plan:   Post-operative Plan:   Informed Consent: I have reviewed the patients History  and Physical, chart, labs and discussed the procedure including the risks, benefits and alternatives for the proposed anesthesia with the patient or authorized representative who has indicated his/her understanding and acceptance.     Dental Advisory Given  Plan Discussed with: Anesthesiologist  Anesthesia Plan Comments: (Patient reports no bleeding problems and no anticoagulant use.   Patient consented for risks of anesthesia including but not limited to:  - adverse reactions to medications - risk of bleeding, infection and or nerve damage from epidural that could lead to paralysis - risk of headache or failed epidural - nerve damage due to positioning - that if epidural is used for C-section that there is a chance of epidural failure requiring spinal placement or conversion to GA - Damage to heart, brain, lungs, other parts of body or loss of life  Patient voiced understanding.)        Anesthesia Quick Evaluation

## 2022-04-16 NOTE — Progress Notes (Signed)
CNM notified of Patient arrival to unit for IOL.

## 2022-04-17 ENCOUNTER — Encounter: Payer: Self-pay | Admitting: Obstetrics and Gynecology

## 2022-04-17 LAB — RPR: RPR Ser Ql: NONREACTIVE

## 2022-04-17 MED ORDER — TETANUS-DIPHTH-ACELL PERTUSSIS 5-2.5-18.5 LF-MCG/0.5 IM SUSY: 0.5000 mL | PREFILLED_SYRINGE | Freq: Once | INTRAMUSCULAR | Status: DC

## 2022-04-17 MED ORDER — ONDANSETRON HCL 4 MG PO TABS: 4.0000 mg | ORAL_TABLET | ORAL | Status: DC | PRN

## 2022-04-17 MED ORDER — ONDANSETRON HCL 4 MG/2ML IJ SOLN: 4.0000 mg | INTRAMUSCULAR | Status: DC | PRN

## 2022-04-17 MED ORDER — ACETAMINOPHEN 325 MG PO TABS
650.0000 mg | ORAL_TABLET | ORAL | Status: DC | PRN
  Administered 2022-04-17: 650 mg via ORAL
  Filled 2022-04-17: qty 2

## 2022-04-17 MED ORDER — DIBUCAINE (PERIANAL) 1 % EX OINT
1.0000 | TOPICAL_OINTMENT | CUTANEOUS | Status: DC | PRN
  Administered 2022-04-17: 1 via RECTAL
  Filled 2022-04-17: qty 28

## 2022-04-17 MED ORDER — BENZOCAINE-MENTHOL 20-0.5 % EX AERO
1.0000 | INHALATION_SPRAY | CUTANEOUS | Status: DC | PRN
  Administered 2022-04-17: 1 via TOPICAL
  Filled 2022-04-17: qty 56

## 2022-04-17 MED ORDER — IBUPROFEN 600 MG PO TABS
600.0000 mg | ORAL_TABLET | Freq: Four times a day (QID) | ORAL | Status: DC
  Administered 2022-04-17 – 2022-04-18 (×5): 600 mg via ORAL
  Filled 2022-04-17 (×5): qty 1

## 2022-04-17 MED ORDER — PRENATAL MULTIVITAMIN CH
1.0000 | ORAL_TABLET | Freq: Every day | ORAL | Status: DC
  Administered 2022-04-17 – 2022-04-18 (×2): 1 via ORAL
  Filled 2022-04-17 (×2): qty 1

## 2022-04-17 MED ORDER — OXYCODONE HCL 5 MG PO TABS: 5.0000 mg | ORAL_TABLET | ORAL | Status: DC | PRN

## 2022-04-17 MED ORDER — WITCH HAZEL-GLYCERIN EX PADS
1.0000 | MEDICATED_PAD | CUTANEOUS | Status: DC | PRN
  Administered 2022-04-17: 1 via TOPICAL
  Filled 2022-04-17: qty 100

## 2022-04-17 MED ORDER — AMPHETAMINE-DEXTROAMPHETAMINE 5 MG PO TABS
10.0000 mg | ORAL_TABLET | Freq: Every day | ORAL | Status: DC
  Filled 2022-04-17: qty 2

## 2022-04-17 MED ORDER — SIMETHICONE 80 MG PO CHEW
80.0000 mg | CHEWABLE_TABLET | ORAL | Status: DC | PRN
  Administered 2022-04-17: 80 mg via ORAL
  Filled 2022-04-17: qty 1

## 2022-04-17 MED ORDER — SENNOSIDES-DOCUSATE SODIUM 8.6-50 MG PO TABS
2.0000 | ORAL_TABLET | Freq: Every day | ORAL | Status: DC
  Administered 2022-04-18: 2 via ORAL
  Filled 2022-04-17: qty 2

## 2022-04-17 MED ORDER — COCONUT OIL OIL: 1.0000 | TOPICAL_OIL | Status: DC | PRN

## 2022-04-17 MED ORDER — OXYCODONE HCL 5 MG PO TABS: 10.0000 mg | ORAL_TABLET | ORAL | Status: DC | PRN

## 2022-04-17 MED ORDER — FERROUS SULFATE 325 (65 FE) MG PO TABS
325.0000 mg | ORAL_TABLET | Freq: Two times a day (BID) | ORAL | Status: DC
  Administered 2022-04-17 – 2022-04-18 (×2): 325 mg via ORAL
  Filled 2022-04-17 (×2): qty 1

## 2022-04-17 MED ORDER — DIPHENHYDRAMINE HCL 25 MG PO CAPS: 25.0000 mg | ORAL_CAPSULE | Freq: Four times a day (QID) | ORAL | Status: DC | PRN

## 2022-04-17 NOTE — Discharge Summary (Signed)
Obstetrical Discharge Summary  Patient Name: Deborah Stafford DOB: 09/05/1985 MRN: [id number]  Date of Admission: 04/16/2022 Date of Delivery: 04/17/22 Delivered by: Deborah Stafford, CNM Date of Discharge: 04/18/2022  Primary [facility]: [facility]  HCW:CBJSEGB'T last menstrual period was 07/07/2021. 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 H/o SAB x2 Covid during pregnancy Thrombocytopenia  Admitting Diagnosis: IOL for gestational thrombocytopenia  Secondary Diagnosis: Patient Active Problem List   Diagnosis Date Noted   NSVD (normal spontaneous vaginal delivery) 04/17/2022   Prolonged second stage of labor, delivered 04/17/2022   Maternal iron deficiency anemia complicating pregnancy in third trimester 01/24/2022   [place] syndrome 10/30/2021   Encounter for supervision of primigravida of advanced maternal age in third trimester 09/21/2021   Pain in limb 01/13/2013   Swelling of limb 01/13/2013   Varicose veins of right lower extremity with complications 01/13/2013    Augmentation: AROM, Pitocin, and IP Foley Complications: Prolonged second stage  Intrapartum complications/course:  Delivery Type: spontaneous vaginal delivery Anesthesia: epidural Placenta: spontaneous Laceration: none Episiotomy: midline  Newborn Data: Live born female  Birth Weight:  7lb 4.4oz APGAR: 9, 10  Newborn Delivery   Birth date/time: 04/17/2022 12:30:00 Delivery type: Vaginal, Spontaneous      36 y.o. [id number] at [redacted]w[redacted]d presenting for IOL, AROM with clear fluid.  She progressed to complete and pushed over an intact perineum and delivered the fetal head, followed promptly by the shoulders. She was in control the whole time, and the baby placed on the maternal abdomen. Delayed cord clamping and the CNM cut the baby's cord, while the baby was skin to skin. The placenta delivered spontaneously and  intact. Median episiotomy repaired in the standard fashion. Mom and baby tolerated the procedure well.   Postpartum Procedures: none  Post partum course:  Patient had an uncomplicated postpartum course.  By time of discharge on PPD#1, her pain was controlled on oral pain medications; she had appropriate lochia and was ambulating, voiding without difficulty and tolerating regular diet.  She was deemed stable for discharge to home.    Discharge Physical Exam:  BP 95/62 (BP Location: Right Arm)   Pulse 86   Temp 98.6 F (37 C) (Oral)   Resp 18   Ht 5\' 4"  (1.626 m)   Wt 77.6 kg   LMP 07/07/2021   SpO2 98%   Breastfeeding Unknown   BMI 29.35 kg/m   General: alert and no distress Pulm: normal respiratory effort Lochia: appropriate Abdomen: soft, NT Uterine Fundus: firm, below umbilicus Perineum: incision intact, approximated, minimal edema Extremities: No evidence of DVT seen on physical exam. No lower extremity edema. Edinburgh:     04/17/2022    7:10 PM  Edinburgh Postnatal Depression Scale Screening Tool  I have been able to laugh and see the funny side of things. 0  I have looked forward with enjoyment to things. 0  I have blamed myself unnecessarily when things went wrong. 1  I have been anxious or worried for no good reason. 1  I have felt scared or panicky for no good reason. 1  Things have been getting on top of me. 1  I have been so unhappy that I have had difficulty sleeping. 0  I have felt sad or miserable. 0  I have been so unhappy that I have been crying. 0  The thought of harming myself has occurred to me. 0  Edinburgh Postnatal Depression Scale  Total 4     Labs:    Latest Ref Rng & Units 04/18/2022    6:07 AM 04/16/2022    2:56 PM 04/20/2021    8:50 AM  CBC  WBC 4.0 - 10.5 K/uL 17.4  9.6  5.3   Hemoglobin 12.0 - 15.0 g/dL 9.9  74.2  59.5   Hematocrit 36.0 - 46.0 % 29.5  36.7  40.9   Platelets 150 - 400 K/uL 101  145  223    A POS Hemoglobin  Date Value  Ref Range Status  04/18/2022 9.9 (L) 12.0 - 15.0 g/dL Final  63/87/5643 32.9 11.1 - 15.9 g/dL Final   HCT  Date Value Ref Range Status  04/18/2022 29.5 (L) 36.0 - 46.0 % Final   Hematocrit  Date Value Ref Range Status  04/20/2021 40.9 34.0 - 46.6 % Final    Disposition: stable, discharge to home Baby Feeding: breastmilk Baby Disposition: home with mom  Contraception: IUD  Prenatal Labs:  Blood type/Rh A POS   Antibody screen Negative    Rubella immune    Varicella Immune  RPR   NR  HBsAg  Neg  Hep C NR   HIV Non Reactive (08/26 0850)   GC neg  Chlamydia neg  Genetic screening cfDNA negative   1 hour GTT 133  3 hour GTT N/a  GBS   neg   Rh Immune globulin given: n/a Rubella vaccine given: Immune Varicella vaccine given: Immune Tdap vaccine given in AP or PP setting: given 02/06/22 Flu vaccine given in AP or PP setting: Not currently in season  Plan: Deborah Stafford was discharged to home in good condition.   Discharge Instructions: Per After Visit Summary. Activity: Advance as tolerated. Pelvic rest for 6 weeks.   Diet: Regular Discharge Medications: Allergies as of 04/18/2022       Reactions   Bee Pollen    Sulfa Antibiotics Other (See Comments)   Fever and redness per pt, nausea, skin hot and painful        Medication List     TAKE these medications    acetaminophen 325 MG tablet Commonly known as: TYLENOL Take 650 mg by mouth every 6 (six) hours as needed.   amphetamine-dextroamphetamine 10 MG tablet Commonly known as: ADDERALL Take 10 mg by mouth daily with breakfast.   benzocaine-Menthol 20-0.5 % Aero Commonly known as: DERMOPLAST Apply 1 Application topically as needed for irritation (perineal discomfort).   dibucaine 1 % Oint Commonly known as: NUPERCAINAL Place 1 Application rectally as needed for hemorrhoids.   ferrous sulfate 325 (65 FE) MG tablet Take 1 tablet (325 mg total) by mouth 2 (two) times daily with a meal. What  changed: when to take this   ibuprofen 600 MG tablet Commonly known as: ADVIL Take 1 tablet (600 mg total) by mouth every 6 (six) hours.   prenatal vitamin w/FE, FA 27-1 MG Tabs tablet Take 1 tablet by mouth daily at 12 noon.   witch hazel-glycerin pad Commonly known as: TUCKS Apply 1 Application topically as needed for hemorrhoids.       Outpatient follow up:   Follow-up Information     Deborah Stafford, CNM Follow up in 6 week(s).   Specialty: Certified Nurse Midwife Why: 6wk postpartum, desires Copper IUD Contact information: [address] [place] [address] [phone]                 Signed:  Janyce Llanos, CNM 04/18/2022 9:08 AM

## 2022-04-17 NOTE — Lactation Note (Signed)
This note was copied from a baby's chart. Lactation Consultation Note  Patient Name: Deborah Stafford DVVOH'Y Date: 04/17/2022 Reason for consult: L&Stafford Initial assessment;Difficult latch Age:36 hours G3P1, SVD  Mother has a hx: [place] syndrome and has an implant in the right breast. She endorses breast changes in both breasts during pregnancy and LC noted colostrum from both breasts.  Lactation to L&Stafford for initial visit. Mother is holding the baby in cradle on the right and attempting to latch. Encouraged feeding on demand and with cues. If baby is not cueing encouraged hand expression and skin to skin. Taught proper technique for hand expression, drops expressed into baby's mouth. Attempted latch on the right in cross cradle and laid back, with and without nipple shield. Sized right nipple to 20 mm and left to 24 mm.  Encouraged to stroke nipple nose to chin, baby is opening wide and attempts to latch but is slipping off or thrusting off. Lower jaw is noted to be slightly recessed and she is holding her tongue back. When she attempts to latch her cheeks are dimpling. Baby began to fall asleep with attempts and was left skin to skin.  Encouraged 8 or more attempts in the first 24 hours and 8 or more good feeds after 24 HOL. Mother asked about pumping. Discussed pumping in the first 24 hours in not necessary and LC would encourage latch attempts, skin to skin and hand expression. Mother has no further questions at this time.   Maternal Data Has patient been taught Hand Expression?: Yes Does the patient have breastfeeding experience prior to this delivery?: No  Feeding Mother's Current Feeding Choice: Breast Milk  LATCH Score Latch: Repeated attempts needed to sustain latch, nipple held in mouth throughout feeding, stimulation needed to elicit sucking reflex.  Audible Swallowing: None  Type of Nipple: Everted at rest and after stimulation (short nipples, R flattens with  stimulation.)  Comfort (Breast/Nipple): Soft / non-tender  Hold (Positioning): Full assist, staff holds infant at breast  LATCH Score: 5   Lactation Tools Discussed/Used Tools: Nipple Shields Nipple shield size: 20;24 (81mm on R, 46mm on L)  Interventions Interventions: Breast feeding basics reviewed;Hand express;Education  Discharge    Consult Status Consult Status: Follow-up from L&Stafford    Deborah Stafford Deborah Stafford 04/17/2022, 2:47 PM

## 2022-04-17 NOTE — Progress Notes (Signed)
Labor Check  Subj:  Complaints: feeling nauseous after epidural placement    Obj:  BP 115/71   Pulse 93   Temp 98.2 F (36.8 C) (Oral)   Resp 18   Ht 5\' 4"  (1.626 m)   Wt 77.6 kg   LMP 07/07/2021   SpO2 100%   BMI 29.35 kg/m  Dose (milli-units/min) Oxytocin: 20 milli-units/min  Cervix: Dilation: 10 / Effacement (%): 100 / Station: Plus 2  Baseline FHR: 95-100 beats/min   Variability: moderate   Accelerations: present   Decelerations: present prolonged decel Contractions: present frequency: 2-4 Overall assessment: reassuring. Notified by RN that immediately after epidural placement, patient begin vomiting and feeling light headed. BP checked and was noted to be hypotensive 80-100's/30-50's. Anesthesia notified and patient given IV fluid bolus, Phenylephrine 80 mg x3 and ephedrine 10 mg x2.for blood pressure support.   A/P: 36 y.o. G3P0020 female at [redacted]w[redacted]d with Gestational Thrombocytopenia.  1.  Labor: progressing normally Pitocin stopped for fetal resuscitation  2.  FWB:  Overall assessment: category 2  3.  GBS neg  4.  Pain: epidural 5.  Recheck: 7/80/-2   12-02-1975 CNM

## 2022-04-17 NOTE — Progress Notes (Signed)
Labor Progress Note  Deborah Stafford is a 36 y.o. G3P0020 at [redacted]w[redacted]d by LMP admitted for induction of labor due to thrombocytopenia.  Subjective: Pt reports constant pressure, but coping well along with her epidural  Objective: BP 115/71   Pulse 93   Temp 98.2 F (36.8 C) (Oral)   Resp 18   Ht 5\' 4"  (1.626 m)   Wt 77.6 kg   LMP 07/07/2021   SpO2 100%   BMI 29.35 kg/m   Fetal Assessment: FHT:  FHR: 130 bpm, variability: moderate,  accelerations:  Present,  decelerations:  Absent Category/reactivity:  Category I UC:   regular, every 1-3 minutes SVE:    Dilation: 10cm  Effacement: 100%  Station:  +2  Consistency: soft  Position: anterior  Membrane status:AROM at 2108 Amniotic color: Clear  Labs: Lab Results  Component Value Date   WBC 9.6 04/16/2022   HGB 12.6 04/16/2022   HCT 36.7 04/16/2022   MCV 89.7 04/16/2022   PLT 145 (L) 04/16/2022    Assessment / Plan: Induction of labor due to thrombocytopenia,  progressing well on pitocin  Labor: Progressing normally Preeclampsia:   115/71 Fetal Wellbeing:  Category I Pain Control:  Epidural I/D:   Afebrile, GBS neg, AROM x 11hr Anticipated MOD:  NSVD  04/18/2022, CNM 04/17/2022, 8:34 AM

## 2022-04-17 NOTE — Progress Notes (Signed)
Deborah Stafford is a 36 y.o. G3P0020 at [redacted]w[redacted]d  With LMP of  07/07/21 & Estimated Date of Delivery: 04/13/22 with [facility]  admitted for gestational thrombocytopenia  Subjective:   Objective: BP 115/71   Pulse 93   Temp 98.2 F (36.8 C) (Oral)   Resp 18   Ht 5\' 4"  (1.626 m)   Wt 77.6 kg   LMP 07/07/2021   SpO2 100%   BMI 29.35 kg/m  I/O last 3 completed shifts: In: 372.9 [I.V.:372.9] Out: 425 [Urine:425] No intake/output data recorded.  FHT:  FHR: 140 bpm, variability: moderate,  accelerations:  Present,  decelerations:  Absent UC:   regular, every 1-4 minutes   Labs: Lab Results  Component Value Date   WBC 9.6 04/16/2022   HGB 12.6 04/16/2022   HCT 36.7 04/16/2022   MCV 89.7 04/16/2022   PLT 145 (L) 04/16/2022    Assessment / Plan: - Category 1 FHT tracing -Restart Pitocin  -continue monitoring maternal BP  Labor:  progressing normally, will restart Pitocin at 2 mil/units Preeclampsia:   N/A Fetal Wellbeing:  Category I Pain Control:  Epidural Anticipated MOD:  NSVD  Deborah Stafford Deborah Stafford Deborah Stafford

## 2022-04-17 NOTE — Progress Notes (Signed)
Labor Progress Note  Deborah Stafford is a 36 y.o. G3P0020 at [redacted]w[redacted]d by LMP admitted for induction of labor due to thrombocytopenia.  Subjective: Pt well, but not constantly   Objective: BP 115/71   Pulse 93   Temp 98.2 F (36.8 C) (Oral)   Resp 18   Ht 5\' 4"  (1.626 m)   Wt 77.6 kg   LMP 07/07/2021   SpO2 100%   BMI 29.35 kg/m   Fetal Assessment: FHT:  FHR: 135 bpm, variability: moderate,  accelerations:  Present,  decelerations:  Present earlies, variable Category/reactivity:  Category I and Category II UC:   regular, every 1-3 minutes SVE:    Dilation: 10cm  Effacement: 100%  Station:  +2  Consistency: soft  Position: anterior  Membrane status:AROM at 2108 Amniotic color: Clear  Labs: Lab Results  Component Value Date   WBC 9.6 04/16/2022   HGB 12.6 04/16/2022   HCT 36.7 04/16/2022   MCV 89.7 04/16/2022   PLT 145 (L) 04/16/2022    Assessment / Plan: Induction of labor due to thrombocytopenia,  progressing well on pitocin  Labor: Progressing normally Preeclampsia:   115/71 Fetal Wellbeing:  Category I Pain Control:  Epidural I/D:   Afebrile, GBS neg, AROM x 12hr Anticipated MOD:  NSVD  Jenifer E Korie Brabson, CNM 04/17/2022, 9:30 AM

## 2022-04-18 LAB — CBC
HCT: 29.5 % — ABNORMAL LOW (ref 36.0–46.0)
Hemoglobin: 9.9 g/dL — ABNORMAL LOW (ref 12.0–15.0)
MCH: 31.2 pg (ref 26.0–34.0)
MCHC: 33.6 g/dL (ref 30.0–36.0)
MCV: 93.1 fL (ref 80.0–100.0)
Platelets: 101 10*3/uL — ABNORMAL LOW (ref 150–400)
RBC: 3.17 MIL/uL — ABNORMAL LOW (ref 3.87–5.11)
RDW: 13.3 % (ref 11.5–15.5)
WBC: 17.4 10*3/uL — ABNORMAL HIGH (ref 4.0–10.5)
nRBC: 0 % (ref 0.0–0.2)

## 2022-04-18 LAB — SURGICAL PATHOLOGY

## 2022-04-18 MED ORDER — DIBUCAINE (PERIANAL) 1 % EX OINT: 1.0000 | TOPICAL_OINTMENT | CUTANEOUS | Status: DC | PRN

## 2022-04-18 MED ORDER — BENZOCAINE-MENTHOL 20-0.5 % EX AERO: 1.0000 | INHALATION_SPRAY | CUTANEOUS | Status: DC | PRN

## 2022-04-18 MED ORDER — WITCH HAZEL-GLYCERIN EX PADS: 1.0000 | MEDICATED_PAD | CUTANEOUS | 12 refills | Status: DC | PRN

## 2022-04-18 MED ORDER — IBUPROFEN 600 MG PO TABS: 600.0000 mg | ORAL_TABLET | Freq: Four times a day (QID) | ORAL | 0 refills | Status: AC

## 2022-04-18 MED ORDER — FERROUS SULFATE 325 (65 FE) MG PO TABS: 325.0000 mg | ORAL_TABLET | Freq: Two times a day (BID) | ORAL | 3 refills | Status: DC

## 2022-04-18 NOTE — Progress Notes (Signed)
Mother discharged. Discharge instructions given. Mother verbalizes understanding. Transported by staff.  

## 2022-04-18 NOTE — Lactation Note (Signed)
This note was copied from a baby's chart. Lactation Consultation Note  Patient Name: Deborah Stafford [id number] Date: 04/18/2022 Reason for consult: Follow-up assessment;Difficult latch Age:36 hours  Lactation Rounds: LC to the room for a visit. Mother and baby are attempting latch on the right in reclined position.  Mother states feeds are not going well at the breast but baby is eating well with the bottle.  LC assisted with putting a drop of colostrum on the tip of the nipple shield and positioned baby across Mother's chest and deep suction with the nipple shield onto the breast. Baby is attempting to latch and pulling back. Baby did make a seal and suckle few sucks and thrusted off again. LC offered to teach SNS with formula at the breast and Mother declined at this time. Reviewed attempting to latch, pumping 8x's/24 hours for at least 15 minutes and paced bottle feeding.  LC reviewed and encouraged feeding on demand and with cues. If baby is not cueing we encourage hand expression and unswaddling baby to wake her. Reviewed diaper counts for days of life and when to call Peds with questions. Reviewed "understanding Postpartum and Newborn care " booklet at bedside. Reviewed outpatient Lactation number and resources and Parents are wanting to follow up with [facility]. Discussed they have LC available at the office. Encouraged to not stop pumping or supplement until follow up with outpt on how well baby is transferring from the breast. Parents stated understanding with all teaching and have no further questions.   Maternal Data Has patient been taught Hand Expression?: Yes Does the patient have breastfeeding experience prior to this delivery?: No  Feeding Mother's Current Feeding Choice: Breast Milk and Formula  LATCH Score Latch: Too sleepy or reluctant, no latch achieved, no sucking elicited. (declined use of SNS at breast)  Interventions Interventions: Breast feeding basics  reviewed;Hand express;Adjust position;Position options;DEBP;Education;Pace feeding  Discharge Discharge Education: Engorgement and breast care;Warning signs for feeding baby Pump: Personal (has a Spectra at home) [facility] Program: No  Consult Status Consult Status: PRN  Dontrez Pettis D Jarom Govan 04/18/2022, 11:07 AM

## 2022-04-18 NOTE — Anesthesia Postprocedure Evaluation (Signed)
Anesthesia Post Note  Patient: Deborah Stafford  Procedure(s) Performed: AN AD HOC LABOR EPIDURAL  Patient location during evaluation: Mother Baby Anesthesia Type: Spinal Level of consciousness: oriented and awake and alert Pain management: pain level controlled Vital Signs Assessment: post-procedure vital signs reviewed and stable Respiratory status: spontaneous breathing and respiratory function stable Cardiovascular status: blood pressure returned to baseline and stable Postop Assessment: no headache, no backache, no apparent nausea or vomiting and able to ambulate Anesthetic complications: no   No notable events documented.   Last Vitals:  Vitals:   04/17/22 2310 04/18/22 0300  BP: 102/63 95/62  Pulse: 98 86  Resp: 18 18  Temp: 36.6 C 37 C  SpO2: 98% 98%    Last Pain:  Vitals:   04/18/22 0730  TempSrc:   PainSc: 4                  Starling Manns

## 2022-06-11 ENCOUNTER — Other Ambulatory Visit: Payer: Self-pay

## 2022-06-11 DIAGNOSIS — Z0000 Encounter for general adult medical examination without abnormal findings: Secondary | ICD-10-CM

## 2022-06-11 DIAGNOSIS — Z114 Encounter for screening for human immunodeficiency virus [HIV]: Secondary | ICD-10-CM

## 2022-06-12 LAB — COMPREHENSIVE METABOLIC PANEL
ALT: 27 IU/L (ref 0–32)
AST: 19 IU/L (ref 0–40)
Albumin/Globulin Ratio: 2.4 — ABNORMAL HIGH (ref 1.2–2.2)
Albumin: 4.8 g/dL (ref 3.9–4.9)
Alkaline Phosphatase: 83 IU/L (ref 44–121)
BUN/Creatinine Ratio: 19 (ref 9–23)
BUN: 12 mg/dL (ref 6–20)
Bilirubin Total: 0.7 mg/dL (ref 0.0–1.2)
CO2: 26 mmol/L (ref 20–29)
Calcium: 9.6 mg/dL (ref 8.7–10.2)
Chloride: 104 mmol/L (ref 96–106)
Creatinine, Ser: 0.62 mg/dL (ref 0.57–1.00)
Globulin, Total: 2 g/dL (ref 1.5–4.5)
Glucose: 83 mg/dL (ref 70–99)
Potassium: 4.2 mmol/L (ref 3.5–5.2)
Sodium: 142 mmol/L (ref 134–144)
Total Protein: 6.8 g/dL (ref 6.0–8.5)
eGFR: 118 mL/min/{1.73_m2} (ref >59)

## 2022-06-12 LAB — CBC WITH DIFFERENTIAL/PLATELET
Basophils Absolute: 0 10*3/uL (ref 0.0–0.2)
Basos: 1 %
EOS (ABSOLUTE): 0 10*3/uL (ref 0.0–0.4)
Eos: 1 %
Hematocrit: 41.2 % (ref 34.0–46.6)
Hemoglobin: 14 g/dL (ref 11.1–15.9)
Immature Grans (Abs): 0 10*3/uL (ref 0.0–0.1)
Immature Granulocytes: 0 %
Lymphocytes Absolute: 2.4 10*3/uL (ref 0.7–3.1)
Lymphs: 41 %
MCH: 30.9 pg (ref 26.6–33.0)
MCHC: 34 g/dL (ref 31.5–35.7)
MCV: 91 fL (ref 79–97)
Monocytes Absolute: 0.3 10*3/uL (ref 0.1–0.9)
Monocytes: 6 %
Neutrophils Absolute: 2.9 10*3/uL (ref 1.4–7.0)
Neutrophils: 51 %
Platelets: 211 10*3/uL (ref 150–450)
RBC: 4.53 x10E6/uL (ref 3.77–5.28)
RDW: 11.9 % (ref 11.7–15.4)
WBC: 5.7 10*3/uL (ref 3.4–10.8)

## 2022-06-12 LAB — LIPID PANEL
Chol/HDL Ratio: 4.2 ratio (ref 0.0–4.4)
Cholesterol, Total: 233 mg/dL — ABNORMAL HIGH (ref 100–199)
HDL: 55 mg/dL (ref >39)
LDL Chol Calc (NIH): 156 mg/dL — ABNORMAL HIGH (ref 0–99)
Triglycerides: 121 mg/dL (ref 0–149)
VLDL Cholesterol Cal: 22 mg/dL (ref 5–40)

## 2022-06-12 LAB — HIV ANTIBODY (ROUTINE TESTING W REFLEX): HIV Screen 4th Generation wRfx: NONREACTIVE

## 2022-06-12 LAB — TSH: TSH: 2.1 u[IU]/mL (ref 0.450–4.500)

## 2022-09-30 ENCOUNTER — Ambulatory Visit (INDEPENDENT_AMBULATORY_CARE_PROVIDER_SITE_OTHER): Payer: Self-pay | Admitting: Physician Assistant

## 2022-09-30 ENCOUNTER — Encounter: Payer: Self-pay | Admitting: Physician Assistant

## 2022-09-30 VITALS — BP 120/80 | HR 85 | Temp 97.5°F | Wt 160.6 lb

## 2022-09-30 DIAGNOSIS — J029 Acute pharyngitis, unspecified: Secondary | ICD-10-CM

## 2022-09-30 DIAGNOSIS — J329 Chronic sinusitis, unspecified: Secondary | ICD-10-CM

## 2022-09-30 LAB — POC SOFIA 2 FLU + SARS ANTIGEN FIA
Influenza A, POC: NEGATIVE
Influenza B, POC: NEGATIVE
SARS Coronavirus 2 Ag: NEGATIVE

## 2022-09-30 MED ORDER — AMOXICILLIN 500 MG PO CAPS: 500.0000 mg | ORAL_CAPSULE | Freq: Two times a day (BID) | ORAL | 0 refills | Status: AC

## 2022-09-30 NOTE — Progress Notes (Signed)
[facility] [address], [place] [address]   Office Visit Note  Patient Name: Deborah Stafford Date of Birth [id number]  Medical Record number [id number]  Date of Service: 09/30/2022  Chief Complaint  Patient presents with   Cough    Cough for several weeks, throat is red. Friday got worse. BA, ST, cough. COVID test last week was Neg. No fever but has been taking Tylenol.      37 y/o F presents to the clinic for c/o sinus headache, nasal congestion, cough, and post nasal drainage x 2 weeks, but worse over the past weekend. Has been taking Ibuprofen and Tylenol for pain and body aches. Started taking Nyquil because unable to sleep due to cough. No known exposure to strep, flu, or covid.   Cough Associated symptoms include postnasal drip and a sore throat. Pertinent negatives include no shortness of breath or wheezing.      Current Medication:  Outpatient Encounter Medications as of 09/30/2022  Medication Sig   acetaminophen (TYLENOL) 325 MG tablet Take 650 mg by mouth every 6 (six) hours as needed.   amoxicillin (AMOXIL) 500 MG capsule Take 1 capsule (500 mg total) by mouth 2 (two) times daily for 7 days.   ibuprofen (ADVIL) 600 MG tablet Take 1 tablet (600 mg total) by mouth every 6 (six) hours.   prenatal vitamin w/FE, FA (PRENATAL 1 + 1) 27-1 MG TABS tablet Take 1 tablet by mouth daily at 12 noon.   amphetamine-dextroamphetamine (ADDERALL) 10 MG tablet Take 10 mg by mouth daily with breakfast. (Patient not taking: Reported on 09/30/2022)   benzocaine-Menthol (DERMOPLAST) 20-0.5 % AERO Apply 1 Application topically as needed for irritation (perineal discomfort). (Patient not taking: Reported on 09/30/2022)   dibucaine (NUPERCAINAL) 1 % OINT Place 1 Application rectally as needed for hemorrhoids. (Patient not taking: Reported on 09/30/2022)   ferrous sulfate 325 (65 FE) MG tablet Take 1 tablet (325 mg total) by mouth 2 (two) times daily with a meal. (Patient not  taking: Reported on 09/30/2022)   witch hazel-glycerin (TUCKS) pad Apply 1 Application topically as needed for hemorrhoids. (Patient not taking: Reported on 09/30/2022)   No facility-administered encounter medications on file as of 09/30/2022.      Medical History: Past Medical History:  Diagnosis Date   Irregular heartbeat    Varicose veins of leg with edema, right      Vital Signs: BP 120/80 (BP Location: Left Arm, Patient Position: Sitting, Cuff Size: Normal)   Pulse 85   Temp (!) 97.5 F (36.4 C) (Tympanic)   Wt 160 lb 9.6 oz (72.8 kg)   SpO2 98%   BMI 27.57 kg/m    Review of Systems  Constitutional: Negative.   HENT:  Positive for congestion, postnasal drip, sinus pressure, sinus pain and sore throat. Negative for trouble swallowing.   Respiratory:  Positive for cough. Negative for chest tightness, shortness of breath and wheezing.   Cardiovascular: Negative.   Neurological: Negative.     Physical Exam Constitutional:      Appearance: Normal appearance.  HENT:     Head: Atraumatic.     Right Ear: Ear canal and external ear normal.     Left Ear: Tympanic membrane, ear canal and external ear normal.     Ears:     Comments: Tympanosclerosis present in the R ear.     Nose:     Right Turbinates: Enlarged and swollen.     Right Sinus: Maxillary sinus  tenderness present.     Left Sinus: Maxillary sinus tenderness present.     Mouth/Throat:     Mouth: Mucous membranes are moist.     Pharynx: Oropharynx is clear.  Eyes:     Extraocular Movements: Extraocular movements intact.  Cardiovascular:     Rate and Rhythm: Normal rate and regular rhythm.  Pulmonary:     Effort: Pulmonary effort is normal.     Breath sounds: Normal breath sounds.  Musculoskeletal:     Cervical back: Neck supple.  Skin:    General: Skin is warm.  Neurological:     Mental Status: She is alert.  Psychiatric:        Mood and Affect: Mood normal.        Behavior: Behavior normal.         Thought Content: Thought content normal.        Judgment: Judgment normal.       Assessment/Plan:  1. Sinusitis, unspecified chronicity, unspecified location - amoxicillin (AMOXIL) 500 MG capsule; Take 1 capsule (500 mg total) by mouth 2 (two) times daily for 7 days.  Dispense: 14 capsule; Refill: 0  2. Sore throat - POC SOFIA 2 FLU + SARS ANTIGEN FIA  Reviewed negative flu and covid test result. Increase fluids Continue with humidifier Start Mucinex as directed on the box. Continue to watch for worsening symptoms. If symptoms don't improve then consider oral antibiotic as prescribed. Pt verbalized understanding and in agreement.   General Counseling: Deborah Stafford verbalizes understanding of the findings of todays visit and agrees with plan of treatment. I have discussed any further diagnostic evaluation that may be needed or ordered today. We also reviewed her medications today. she has been encouraged to call the office with any questions or concerns that should arise related to todays visit.    Time spent:20 [place], [place] Physician Assistant

## 2022-11-18 ENCOUNTER — Ambulatory Visit (INDEPENDENT_AMBULATORY_CARE_PROVIDER_SITE_OTHER): Payer: Self-pay | Admitting: Physician Assistant

## 2022-11-18 VITALS — BP 114/62 | HR 88 | Temp 98.8°F | Resp 16

## 2022-11-18 DIAGNOSIS — R051 Acute cough: Secondary | ICD-10-CM

## 2022-11-18 DIAGNOSIS — J069 Acute upper respiratory infection, unspecified: Secondary | ICD-10-CM

## 2022-11-18 MED ORDER — BENZONATATE 200 MG PO CAPS: 200.0000 mg | ORAL_CAPSULE | Freq: Three times a day (TID) | ORAL | 0 refills | Status: DC | PRN

## 2022-11-18 NOTE — Progress Notes (Signed)
[facility] [address], [place] [address]   Office Visit Note  Patient Name: Deborah Stafford Date of Birth [id number]  Medical Record number [id number]  Date of Service: 11/18/2022  Chief Complaint  Patient presents with   URI     37 y/o F presents to the clinic for c/o cough, itchy throat, rhinorrhea x few days. Denies CP, SOB,wheezing, body aches, chills, or fevers. Has been taking Nyquil at night and Mucinex during the day.   URI  Associated symptoms include congestion, coughing and a sore throat (itchy). Pertinent negatives include no wheezing.      Current Medication:  Outpatient Encounter Medications as of 11/18/2022  Medication Sig   benzonatate (TESSALON) 200 MG capsule Take 1 capsule (200 mg total) by mouth 3 (three) times daily as needed for cough.   acetaminophen (TYLENOL) 325 MG tablet Take 650 mg by mouth every 6 (six) hours as needed.   amphetamine-dextroamphetamine (ADDERALL) 10 MG tablet Take 10 mg by mouth daily with breakfast. (Patient not taking: Reported on 09/30/2022)   benzocaine-Menthol (DERMOPLAST) 20-0.5 % AERO Apply 1 Application topically as needed for irritation (perineal discomfort). (Patient not taking: Reported on 09/30/2022)   dibucaine (NUPERCAINAL) 1 % OINT Place 1 Application rectally as needed for hemorrhoids. (Patient not taking: Reported on 09/30/2022)   ferrous sulfate 325 (65 FE) MG tablet Take 1 tablet (325 mg total) by mouth 2 (two) times daily with a meal. (Patient not taking: Reported on 09/30/2022)   ibuprofen (ADVIL) 600 MG tablet Take 1 tablet (600 mg total) by mouth every 6 (six) hours.   prenatal vitamin w/FE, FA (PRENATAL 1 + 1) 27-1 MG TABS tablet Take 1 tablet by mouth daily at 12 noon.   witch hazel-glycerin (TUCKS) pad Apply 1 Application topically as needed for hemorrhoids. (Patient not taking: Reported on 09/30/2022)   No facility-administered encounter medications on file as of 11/18/2022.      Medical  History: Past Medical History:  Diagnosis Date   Irregular heartbeat    Varicose veins of leg with edema, right      Vital Signs: BP 114/62 (BP Location: Left Arm, Patient Position: Sitting, Cuff Size: Normal)   Pulse 88   Temp 98.8 F (37.1 C) (Tympanic)   Resp 16   SpO2 98%    Review of Systems  Constitutional: Negative.   HENT:  Positive for congestion and sore throat (itchy). Negative for postnasal drip and trouble swallowing.   Eyes: Negative.   Respiratory:  Positive for cough. Negative for chest tightness, shortness of breath and wheezing.   Cardiovascular: Negative.   Neurological: Negative.     Physical Exam Constitutional:      Appearance: Normal appearance.  HENT:     Head: Atraumatic.     Right Ear: Tympanic membrane, ear canal and external ear normal.     Left Ear: Tympanic membrane, ear canal and external ear normal.     Nose: Nose normal.     Mouth/Throat:     Mouth: Mucous membranes are moist.     Pharynx: Oropharynx is clear.  Eyes:     Extraocular Movements: Extraocular movements intact.  Cardiovascular:     Rate and Rhythm: Normal rate and regular rhythm.  Pulmonary:     Effort: Pulmonary effort is normal.     Breath sounds: Normal breath sounds.  Musculoskeletal:     Cervical back: Neck supple.  Skin:    General: Skin is warm.  Neurological:  Mental Status: She is alert.  Psychiatric:        Mood and Affect: Mood normal.        Behavior: Behavior normal.        Thought Content: Thought content normal.        Judgment: Judgment normal.       Assessment/Plan:  1. Viral upper respiratory tract infection  2. Acute cough - benzonatate (TESSALON) 200 MG capsule; Take 1 capsule (200 mg total) by mouth 3 (three) times daily as needed for cough.  Dispense: 20 capsule; Refill: 0  Increase fluids Start oral anti-histamine as directed on the box. Start anti-tussive medicine. Watch for worsening symptoms. RTC prn Pt verbalized  understanding and in agreement.   General Counseling: Deborah Stafford verbalizes understanding of the findings of todays visit and agrees with plan of treatment. I have discussed any further diagnostic evaluation that may be needed or ordered today. We also reviewed her medications today. she has been encouraged to call the office with any questions or concerns that should arise related to todays visit.    Time spent:20 Deborah Stafford, [place] Physician Assistant

## 2023-04-07 ENCOUNTER — Ambulatory Visit (INDEPENDENT_AMBULATORY_CARE_PROVIDER_SITE_OTHER): Payer: Self-pay | Admitting: Physician Assistant

## 2023-04-07 DIAGNOSIS — U071 COVID-19: Secondary | ICD-10-CM

## 2023-04-07 MED ORDER — NIRMATRELVIR/RITONAVIR (PAXLOVID)TABLET: 3.0000 | ORAL_TABLET | Freq: Two times a day (BID) | ORAL | 0 refills | Status: AC

## 2023-04-07 NOTE — Progress Notes (Signed)
Virtual Visit Consent   Deborah Stafford, you are scheduled for a virtual visit with a [facility] provider today. Just as with appointments in the office, your consent must be obtained to participate. Your consent will be active for this visit and any virtual visit you may have with one of our providers in the next 365 days. If you have a MyChart account, a copy of this consent can be sent to you electronically.  As this is a virtual telephone visit which doesn't allow your provider to perform a traditional examination and may limit your provider's ability to fully assess your condition. If your provider identifies any concerns that need to be evaluated in person or the need to arrange testing (such as labs, EKG, etc.), we will make arrangements to do so. Although advances in technology are sophisticated, we cannot ensure that it will always work on either your end or our end. If the connection with a video visit is poor, the visit may have to be switched to a telephone visit. With either a video or telephone visit, we are not always able to ensure that we have a secure connection.  By engaging in this virtual visit, you consent to the provision of healthcare and authorize for your insurance to be billed (if applicable) for the services provided during this visit. Depending on your insurance coverage, you may receive a charge related to this service.  I need to obtain your verbal consent now. Are you willing to proceed with your visit today? Deborah Stafford has provided verbal consent on 04/07/2023 for a virtual visit (video or telephone). Deborah Stafford, [place]  Date: 04/07/2023 9:33 AM  Virtual Visit via Telephone Note   I, Deborah Stafford, connected with  Deborah Stafford  ([id number], 04-19-1986) on 04/07/23 at  9:20 AM EDT by a telephone and verified that I am speaking with the correct person using two identifiers.  Location: Patient: Virtual Visit Location Patient: Home Provider:  Virtual Visit Location Provider: Office/Clinic   I discussed the limitations of evaluation and management by telemedicine and the availability of in person appointments. The patient expressed understanding and agreed to proceed.    History of Present Illness: Deborah Stafford is a 37 y.o. who identifies as a female who was assigned female at birth, and is being seen today for testing positive for covid infection. Symptoms started 2 days ago.  HPI: 37 y/o F presents to the clinic for c/o chest congestion, cough, body aches x 2 days. Has been taking otc Mucinex with slight relief.     Problems:  Patient Active Problem List   Diagnosis Date Noted   NSVD (normal spontaneous vaginal delivery) 04/17/2022   Prolonged second stage of labor, delivered 04/17/2022   Maternal iron deficiency anemia complicating pregnancy in third trimester 01/24/2022   [place] syndrome 10/30/2021   Encounter for supervision of primigravida of advanced maternal age in third trimester 09/21/2021   Pain in limb 01/13/2013   Swelling of limb 01/13/2013   Varicose veins of right lower extremity with complications 01/13/2013    Allergies:  Allergies  Allergen Reactions   Bee Pollen    Sulfa Antibiotics Other (See Comments)    Fever and redness per pt, nausea, skin hot and painful   Medications:  Current Outpatient Medications:    nirmatrelvir/ritonavir (PAXLOVID) 20 x 150 MG & 10 x 100MG  TABS, Take 3 tablets by mouth 2 (two) times daily for 5 days. (Take nirmatrelvir 150 mg two tablets twice daily  for 5 days and ritonavir 100 mg one tablet twice daily for 5 days) Patient GFR is 118, Disp: 30 tablet, Rfl: 0   acetaminophen (TYLENOL) 325 MG tablet, Take 650 mg by mouth every 6 (six) hours as needed., Disp: , Rfl:    amphetamine-dextroamphetamine (ADDERALL) 10 MG tablet, Take 10 mg by mouth daily with breakfast. (Patient not taking: Reported on 09/30/2022), Disp: , Rfl:    benzocaine-Menthol (DERMOPLAST) 20-0.5 %  AERO, Apply 1 Application topically as needed for irritation (perineal discomfort). (Patient not taking: Reported on 09/30/2022), Disp: , Rfl:    benzonatate (TESSALON) 200 MG capsule, Take 1 capsule (200 mg total) by mouth 3 (three) times daily as needed for cough., Disp: 20 capsule, Rfl: 0   dibucaine (NUPERCAINAL) 1 % OINT, Place 1 Application rectally as needed for hemorrhoids. (Patient not taking: Reported on 09/30/2022), Disp: , Rfl:    ferrous sulfate 325 (65 FE) MG tablet, Take 1 tablet (325 mg total) by mouth 2 (two) times daily with a meal. (Patient not taking: Reported on 09/30/2022), Disp: , Rfl: 3   ibuprofen (ADVIL) 600 MG tablet, Take 1 tablet (600 mg total) by mouth every 6 (six) hours., Disp: 30 tablet, Rfl: 0   prenatal vitamin w/FE, FA (PRENATAL 1 + 1) 27-1 MG TABS tablet, Take 1 tablet by mouth daily at 12 noon., Disp: , Rfl:    witch hazel-glycerin (TUCKS) pad, Apply 1 Application topically as needed for hemorrhoids. (Patient not taking: Reported on 09/30/2022), Disp: 40 each, Rfl: 12  Observations/Objective: Patient is well-developed, well-nourished in no acute distress.  Resting comfortably at home.  Head is normocephalic, atraumatic.  No labored breathing.  Speech is clear and coherent with logical content.  Patient is alert and oriented at baseline.    Assessment and Plan: 1. COVID-19 virus infection - nirmatrelvir/ritonavir (PAXLOVID) 20 x 150 MG & 10 x 100MG  TABS; Take 3 tablets by mouth 2 (two) times daily for 5 days. (Take nirmatrelvir 150 mg two tablets twice daily for 5 days and ritonavir 100 mg one tablet twice daily for 5 days) Patient GFR is 118  Dispense: 30 tablet; Refill: 0  Increase fluids Alternate Tylenol or Ibuprofen every 4 hours as instructed. May continue to take Mucinex for cough Briefly discussed taking otc oral decongestant ie Sudafed as directed on the box. Take Paxlovid as prescribed.  Reviewed CDC guidelines with patient. Pt will be isolated for 5  days from the start of symptoms. Needs to be fever free for 24 hours without the aid of medicine before returning back to work. Must wear a mask for an additional 5 days when at workplace. She verbalized understanding and in agreement.    Follow Up Instructions: I discussed the assessment and treatment plan with the patient. The patient was provided an opportunity to ask questions and all were answered. The patient agreed with the plan and demonstrated an understanding of the instructions.  A copy of instructions were sent to the patient via MyChart unless otherwise noted below.    The patient was advised to call back or seek an in-person evaluation if the symptoms worsen or if the condition fails to improve as anticipated.  Time:  I spent 10 minutes with the patient via telehealth technology discussing the above problems/concerns.    Deborah Better, PA-C

## 2023-05-27 ENCOUNTER — Other Ambulatory Visit: Payer: Self-pay

## 2023-05-27 ENCOUNTER — Ambulatory Visit (INDEPENDENT_AMBULATORY_CARE_PROVIDER_SITE_OTHER): Payer: Self-pay | Admitting: Physician Assistant

## 2023-05-27 ENCOUNTER — Encounter: Payer: Self-pay | Admitting: Physician Assistant

## 2023-05-27 VITALS — BP 110/70 | HR 77 | Temp 97.4°F | Ht 64.0 in | Wt 147.0 lb

## 2023-05-27 DIAGNOSIS — J029 Acute pharyngitis, unspecified: Secondary | ICD-10-CM

## 2023-05-27 LAB — POC SOFIA 2 FLU + SARS ANTIGEN FIA
Influenza A, POC: NEGATIVE
Influenza B, POC: NEGATIVE
SARS Coronavirus 2 Ag: NEGATIVE

## 2023-05-27 LAB — POCT RAPID STREP A (OFFICE): Rapid Strep A Screen: NEGATIVE

## 2023-05-27 NOTE — Progress Notes (Signed)
[facility] [address] [place], [place] [address]   Office Visit Note  Patient Name: Deborah Stafford Date of Birth [id number]  Medical Record number [id number]  Date of Service: 05/27/2023  Chief Complaint  Patient presents with   Acute Visit    Patient c/o sore throat with mild swelling in her neck. She also reports having a headache last night at which time she took Tylenol which was helpful. She tested negative for COVID at home yesterday. She states she had COVID at the beginning of August. Her husband wasn't feeling well last week but tested negative for flu/COVID.     The patient is a 37 year old female presenting with a few days of headache, swollen lymph nodes, and sore throat. She reports that her husband tested negative for both flu and COVID-19 last week. The patient had COVID-19 in the second week of August, coinciding with her daughter starting daycare. She stayed home yesterday, resting all day, and took Tylenol last night along with NyQuil the night before to manage her symptoms.  The patient notes a history of several viral illnesses this year. She is currently taking allergy medications and sometimes adds Singulair, which she reports helps with her symptoms. Concerned about her current condition, she would like to be tested for flu, COVID-19, and strep throat today to understand the cause of her symptoms and to determine if she needs to take precautions to protect her daughter.     Pt is here for a sick visit.   Current Medication:  Outpatient Encounter Medications as of 05/27/2023  Medication Sig   acetaminophen (TYLENOL) 325 MG tablet Take 650 mg by mouth every 6 (six) hours as needed.   amphetamine-dextroamphetamine (ADDERALL) 10 MG tablet Take 20 mg by mouth daily with breakfast.   ibuprofen (ADVIL) 600 MG tablet Take 1 tablet (600 mg total) by mouth every 6 (six) hours. (Patient taking differently: Take 600 mg by mouth every 6 (six) hours as  needed.)   Multiple Vitamin (MULTIVITAMIN) capsule Take 1 capsule by mouth daily.   [DISCONTINUED] benzocaine-Menthol (DERMOPLAST) 20-0.5 % AERO Apply 1 Application topically as needed for irritation (perineal discomfort). (Patient not taking: Reported on 09/30/2022)   [DISCONTINUED] benzonatate (TESSALON) 200 MG capsule Take 1 capsule (200 mg total) by mouth 3 (three) times daily as needed for cough.   [DISCONTINUED] dibucaine (NUPERCAINAL) 1 % OINT Place 1 Application rectally as needed for hemorrhoids. (Patient not taking: Reported on 09/30/2022)   [DISCONTINUED] ferrous sulfate 325 (65 FE) MG tablet Take 1 tablet (325 mg total) by mouth 2 (two) times daily with a meal. (Patient not taking: Reported on 09/30/2022)   [DISCONTINUED] prenatal vitamin w/FE, FA (PRENATAL 1 + 1) 27-1 MG TABS tablet Take 1 tablet by mouth daily at 12 noon.   [DISCONTINUED] witch hazel-glycerin (TUCKS) pad Apply 1 Application topically as needed for hemorrhoids. (Patient not taking: Reported on 09/30/2022)   No facility-administered encounter medications on file as of 05/27/2023.      Medical History: Past Medical History:  Diagnosis Date   Irregular heartbeat    Varicose veins of leg with edema, right      Vital Signs: BP 110/70   Pulse 77   Temp (!) 97.4 F (36.3 C)   Ht 5\' 4"  (1.626 m)   Wt 147 lb (66.7 kg)   SpO2 98%   BMI 25.23 kg/m    Review of Systems  Constitutional:  Positive for fatigue.  HENT:  Positive for sore throat.  Respiratory: Negative.    Cardiovascular: Negative.   Neurological:  Positive for headaches.    Physical Exam Constitutional:      Appearance: Normal appearance.  HENT:     Head: Normocephalic and atraumatic.     Mouth/Throat:     Pharynx: Posterior oropharyngeal erythema present.  Eyes:     Conjunctiva/sclera: Conjunctivae normal.  Cardiovascular:     Rate and Rhythm: Normal rate and regular rhythm.     Heart sounds: Normal heart sounds.  Pulmonary:     Effort:  Pulmonary effort is normal.     Breath sounds: Normal breath sounds.  Lymphadenopathy:     Cervical: Cervical adenopathy present.  Neurological:     Mental Status: She is alert.       Assessment/Plan: 1. Sore throat - POCT rapid strep A - POC SOFIA 2 FLU + SARS ANTIGEN FIA   The patient is a 37 year old female presenting with several days of viral symptoms, including headache, swollen cervical lymph nodes, and sore throat. On examination, she shows cervical lymphadenopathy and erythema of the throat. Initial testing for flu and COVID-19 was negative, and while there is low suspicion for strep throat due to the absence of exudates, a rapid strep test was performed for reassurance and returned negative. Her symptoms are most likely attributed to a viral cause, and the remainder of the examination was within normal limits. The patient is encouraged to continue hydrating and using over-the-counter medications such as Tylenol or ibuprofen for symptom relief, as well as NyQuil as needed. She was educated on precautions to avoid spreading the virus, including frequent handwashing and wearing a mask if symptomatic in shared spaces.  General Counseling: Deborah Stafford verbalizes understanding of the findings of todays visit and agrees with plan of treatment. I have discussed any further diagnostic evaluation that may be needed or ordered today. We also reviewed her medications today. she has been encouraged to call the office with any questions or concerns that should arise related to todays visit.   Orders Placed This Encounter  Procedures   POCT rapid strep A   POC SOFIA 2 FLU + SARS ANTIGEN FIA    No orders of the defined types were placed in this encounter.   Time spent:30 Minutes  Jerelyn Scott, PA-C Physician Assistant

## 2023-06-04 ENCOUNTER — Other Ambulatory Visit: Payer: Self-pay

## 2023-06-04 DIAGNOSIS — Z13220 Encounter for screening for lipoid disorders: Secondary | ICD-10-CM

## 2023-06-04 DIAGNOSIS — Z114 Encounter for screening for human immunodeficiency virus [HIV]: Secondary | ICD-10-CM

## 2023-06-04 DIAGNOSIS — Z0000 Encounter for general adult medical examination without abnormal findings: Secondary | ICD-10-CM

## 2023-06-18 ENCOUNTER — Other Ambulatory Visit: Payer: Self-pay

## 2023-06-18 DIAGNOSIS — Z0000 Encounter for general adult medical examination without abnormal findings: Secondary | ICD-10-CM

## 2023-06-18 DIAGNOSIS — Z114 Encounter for screening for human immunodeficiency virus [HIV]: Secondary | ICD-10-CM

## 2023-06-18 DIAGNOSIS — Z13220 Encounter for screening for lipoid disorders: Secondary | ICD-10-CM

## 2023-06-19 ENCOUNTER — Other Ambulatory Visit: Payer: Self-pay

## 2023-06-19 DIAGNOSIS — Z0000 Encounter for general adult medical examination without abnormal findings: Secondary | ICD-10-CM

## 2023-06-19 DIAGNOSIS — Z114 Encounter for screening for human immunodeficiency virus [HIV]: Secondary | ICD-10-CM

## 2023-06-19 DIAGNOSIS — Z13220 Encounter for screening for lipoid disorders: Secondary | ICD-10-CM

## 2023-06-20 LAB — CBC WITH DIFFERENTIAL/PLATELET
Basophils Absolute: 0 10*3/uL (ref 0.0–0.2)
Basos: 1 %
EOS (ABSOLUTE): 0.1 10*3/uL (ref 0.0–0.4)
Eos: 1 %
Hematocrit: 40.9 % (ref 34.0–46.6)
Hemoglobin: 12.9 g/dL (ref 11.1–15.9)
Immature Grans (Abs): 0 10*3/uL (ref 0.0–0.1)
Immature Granulocytes: 0 %
Lymphocytes Absolute: 2.4 10*3/uL (ref 0.7–3.1)
Lymphs: 38 %
MCH: 29.3 pg (ref 26.6–33.0)
MCHC: 31.5 g/dL (ref 31.5–35.7)
MCV: 93 fL (ref 79–97)
Monocytes Absolute: 0.4 10*3/uL (ref 0.1–0.9)
Monocytes: 6 %
Neutrophils Absolute: 3.3 10*3/uL (ref 1.4–7.0)
Neutrophils: 54 %
Platelets: 225 10*3/uL (ref 150–450)
RBC: 4.4 x10E6/uL (ref 3.77–5.28)
RDW: 12.3 % (ref 11.7–15.4)
WBC: 6.2 10*3/uL (ref 3.4–10.8)

## 2023-06-20 LAB — COMPREHENSIVE METABOLIC PANEL
ALT: 16 [IU]/L (ref 0–32)
AST: 15 [IU]/L (ref 0–40)
Albumin: 4.9 g/dL (ref 3.9–4.9)
Alkaline Phosphatase: 69 [IU]/L (ref 44–121)
BUN/Creatinine Ratio: 23 (ref 9–23)
BUN: 16 mg/dL (ref 6–20)
Bilirubin Total: 0.6 mg/dL (ref 0.0–1.2)
CO2: 27 mmol/L (ref 20–29)
Calcium: 9.4 mg/dL (ref 8.7–10.2)
Chloride: 102 mmol/L (ref 96–106)
Creatinine, Ser: 0.71 mg/dL (ref 0.57–1.00)
Globulin, Total: 2 g/dL (ref 1.5–4.5)
Glucose: 85 mg/dL (ref 70–99)
Potassium: 3.8 mmol/L (ref 3.5–5.2)
Sodium: 142 mmol/L (ref 134–144)
Total Protein: 6.9 g/dL (ref 6.0–8.5)
eGFR: 112 mL/min/{1.73_m2} (ref >59)

## 2023-06-20 LAB — LIPID PANEL W/O CHOL/HDL RATIO
Cholesterol, Total: 187 mg/dL (ref 100–199)
HDL: 48 mg/dL (ref >39)
LDL Chol Calc (NIH): 124 mg/dL — ABNORMAL HIGH (ref 0–99)
Triglycerides: 81 mg/dL (ref 0–149)
VLDL Cholesterol Cal: 15 mg/dL (ref 5–40)

## 2023-06-20 LAB — TSH: TSH: 1.14 u[IU]/mL (ref 0.450–4.500)

## 2023-06-20 LAB — HIV ANTIBODY (ROUTINE TESTING W REFLEX): HIV Screen 4th Generation wRfx: NONREACTIVE

## 2023-07-21 ENCOUNTER — Ambulatory Visit (INDEPENDENT_AMBULATORY_CARE_PROVIDER_SITE_OTHER): Payer: Self-pay | Admitting: Physician Assistant

## 2023-07-21 ENCOUNTER — Encounter: Payer: Self-pay | Admitting: Physician Assistant

## 2023-07-21 ENCOUNTER — Other Ambulatory Visit: Payer: Self-pay

## 2023-07-21 VITALS — BP 110/78 | HR 102 | Temp 97.5°F

## 2023-07-21 DIAGNOSIS — R051 Acute cough: Secondary | ICD-10-CM

## 2023-07-21 DIAGNOSIS — J9801 Acute bronchospasm: Secondary | ICD-10-CM

## 2023-07-21 MED ORDER — METHYLPREDNISOLONE 4 MG PO TBPK: ORAL_TABLET | ORAL | 0 refills | Status: DC

## 2023-07-21 MED ORDER — BENZONATATE 200 MG PO CAPS
200.0000 mg | ORAL_CAPSULE | Freq: Three times a day (TID) | ORAL | 0 refills | Status: DC | PRN
Start: 2023-07-21 — End: 2024-01-05

## 2023-07-21 MED ORDER — ALBUTEROL SULFATE HFA 108 (90 BASE) MCG/ACT IN AERS: 2.0000 | INHALATION_SPRAY | Freq: Four times a day (QID) | RESPIRATORY_TRACT | 1 refills | Status: DC | PRN

## 2023-07-21 NOTE — Progress Notes (Signed)
[facility] [address] [place], [place] [address]   Office Visit Note  Patient Name: Deborah Stafford Date of Birth [id number]  Medical Record number [id number]  Date of Service: 07/21/2023  Chief Complaint  Patient presents with   Cough    Dry cough x 5 days. Patient also c/o sore throat, possibly d/t postnasal drainage. During initial onset of symptoms, she states she had a low-grade fever of 100.1 which has since subsided. She has been taking Mucinex, Nyquil, and OTC cough suppressant but has seen no improvement in symptoms.     37 y/o F presents to the clinic for c/o coughing fits x 4 days and not improving. No fever or chills. No CP. Feels SOB with continued cough. Tested negative for covid earlier.   Cough Associated symptoms include shortness of breath. Pertinent negatives include no wheezing.      Current Medication:  Outpatient Encounter Medications as of 07/21/2023  Medication Sig   acetaminophen (TYLENOL) 325 MG tablet Take 650 mg by mouth every 6 (six) hours as needed.   albuterol (VENTOLIN HFA) 108 (90 Base) MCG/ACT inhaler Inhale 2 puffs into the lungs every 6 (six) hours as needed for wheezing or shortness of breath.   amphetamine-dextroamphetamine (ADDERALL) 10 MG tablet Take 20 mg by mouth daily with breakfast.   benzonatate (TESSALON) 200 MG capsule Take 1 capsule (200 mg total) by mouth 3 (three) times daily as needed for cough.   ibuprofen (ADVIL) 600 MG tablet Take 1 tablet (600 mg total) by mouth every 6 (six) hours. (Patient taking differently: Take 600 mg by mouth every 6 (six) hours as needed.)   methylPREDNISolone (MEDROL DOSEPAK) 4 MG TBPK tablet Take as directed   Multiple Vitamin (MULTIVITAMIN) capsule Take 1 capsule by mouth daily.   No facility-administered encounter medications on file as of 07/21/2023.      Medical History: Past Medical History:  Diagnosis Date   Irregular heartbeat    Varicose veins of leg with edema,  right      Vital Signs: BP 110/78   Pulse (!) 102   Temp (!) 97.5 F (36.4 C)   SpO2 96%    Review of Systems  Constitutional: Negative.   HENT: Negative.    Respiratory:  Positive for cough and shortness of breath. Negative for chest tightness and wheezing.   Cardiovascular: Negative.   Neurological: Negative.     Physical Exam Constitutional:      Appearance: Normal appearance.  HENT:     Head: Atraumatic.     Right Ear: Tympanic membrane, ear canal and external ear normal.     Left Ear: Tympanic membrane, ear canal and external ear normal.     Nose: Nose normal.     Mouth/Throat:     Mouth: Mucous membranes are moist.     Pharynx: Oropharynx is clear.  Eyes:     Extraocular Movements: Extraocular movements intact.  Cardiovascular:     Rate and Rhythm: Normal rate and regular rhythm.  Pulmonary:     Effort: Pulmonary effort is normal.     Breath sounds: Normal breath sounds. No wheezing or rales.     Comments: +dry frequent coughing fits.  Musculoskeletal:     Cervical back: Neck supple.  Skin:    General: Skin is warm.  Neurological:     Mental Status: She is alert.  Psychiatric:        Mood and Affect: Mood normal.  Behavior: Behavior normal.        Thought Content: Thought content normal.        Judgment: Judgment normal.       Assessment/Plan:  1. Bronchospasm - albuterol (VENTOLIN HFA) 108 (90 Base) MCG/ACT inhaler; Inhale 2 puffs into the lungs every 6 (six) hours as needed for wheezing or shortness of breath.  Dispense: 8 g; Refill: 1 - methylPREDNISolone (MEDROL DOSEPAK) 4 MG TBPK tablet; Take as directed  Dispense: 1 each; Refill: 0  2. Acute cough - benzonatate (TESSALON) 200 MG capsule; Take 1 capsule (200 mg total) by mouth 3 (three) times daily as needed for cough.  Dispense: 30 capsule; Refill: 0  Reviewed my clinical findings with patient. Start with medicines as prescribed Increase fluids Continue to watch for worsening  symptoms If symptoms don't improve in 5-7 days then RTC for re-evaluation. Pt verbalized understanding and in agreement.   General Counseling: Shalinda verbalizes understanding of the findings of todays visit and agrees with plan of treatment. I have discussed any further diagnostic evaluation that may be needed or ordered today. We also reviewed her medications today. she has been encouraged to call the office with any questions or concerns that should arise related to todays visit.    Time spent:20 Minutes    Gilberto Better, [place] Physician Assistant

## 2023-07-29 IMAGING — US US MFM OB DETAIL+14 WK
1 series · 13 of 28 positions shown · non-contrast
Comparison: none

[Series 1: us mfm ob detail+14 wk · 137 acquisitions, 13 frames shown]
[im 6/137]
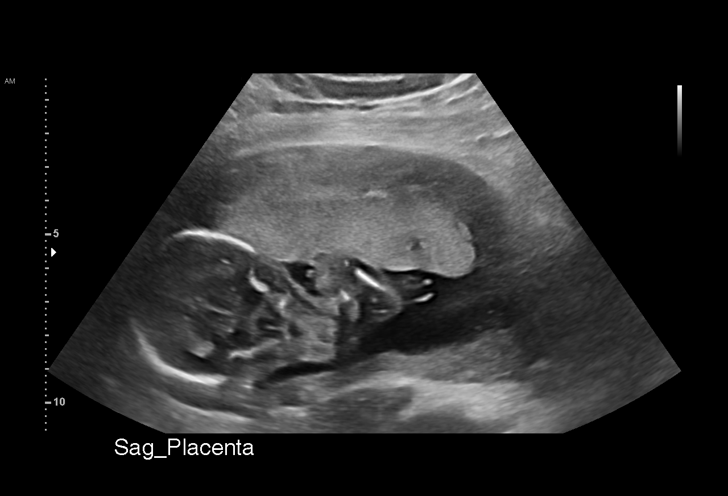
[im 16/137]
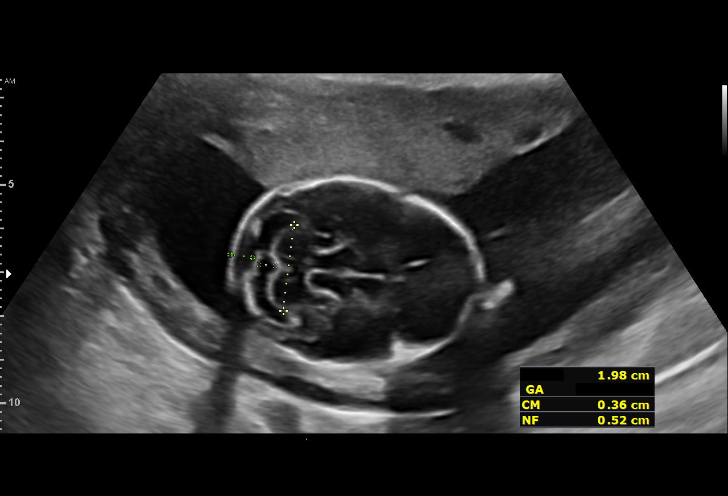
[im 26/137]
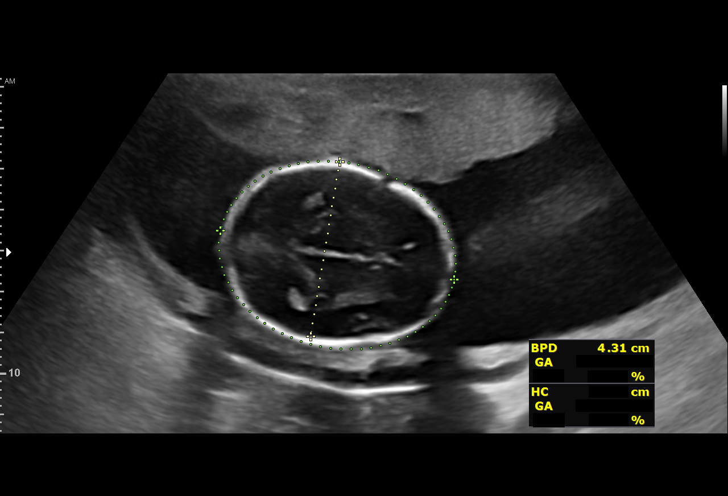
[im 36/137]
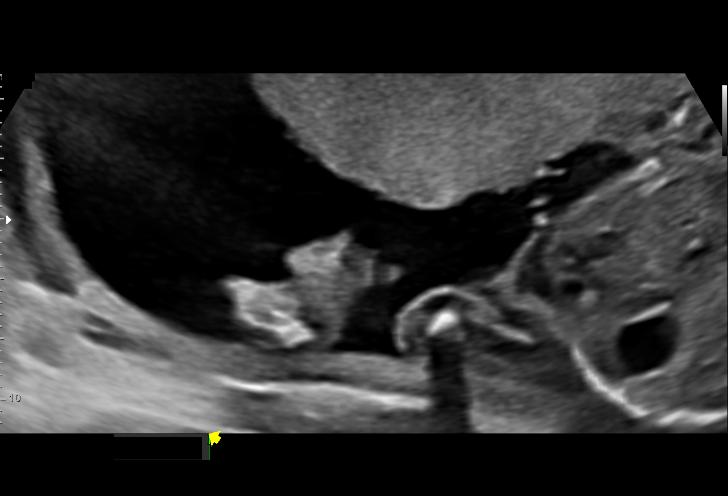
[im 46/137]
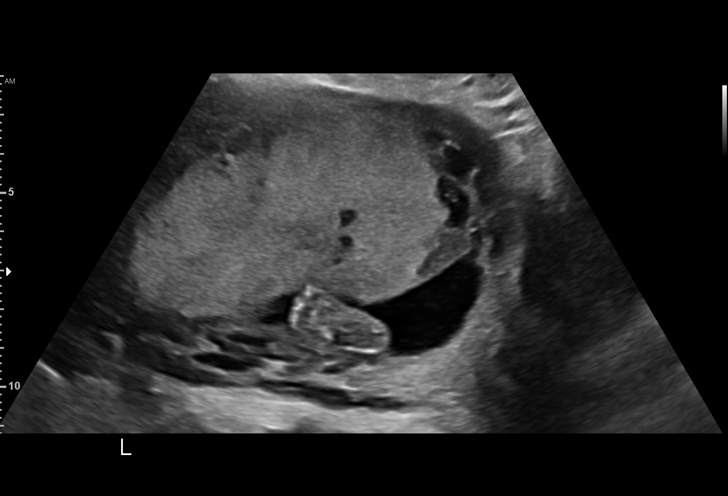
[im 56/137]
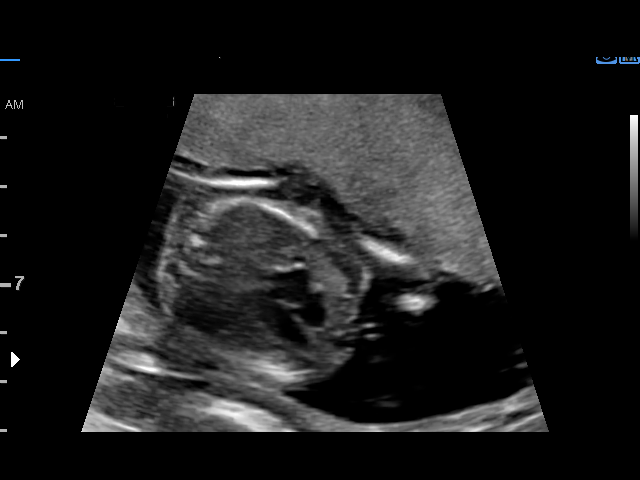
[im 71/137]
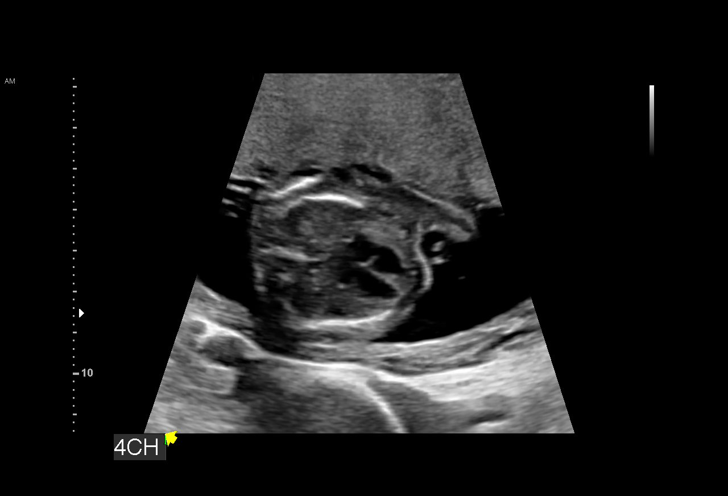
[im 81/137]
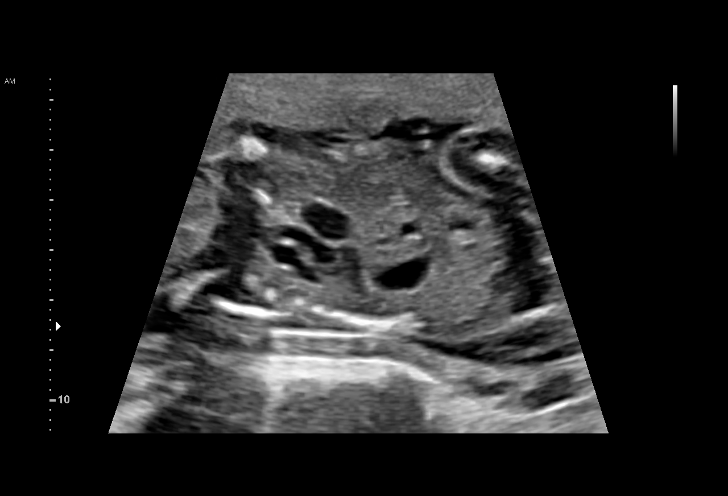
[im 91/137]
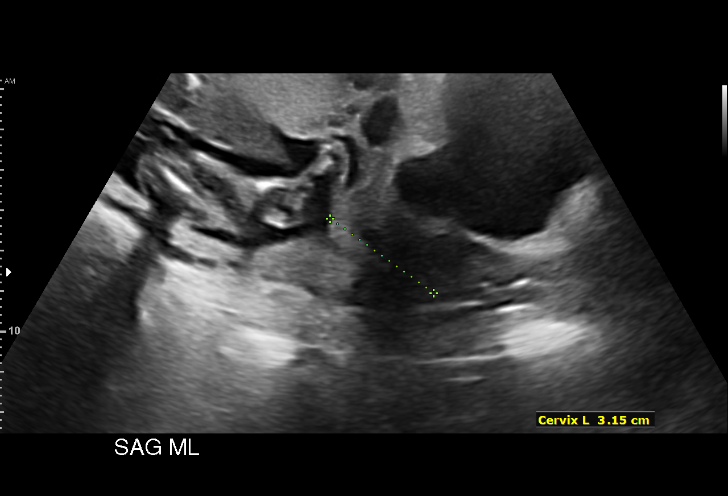
[im 101/137]
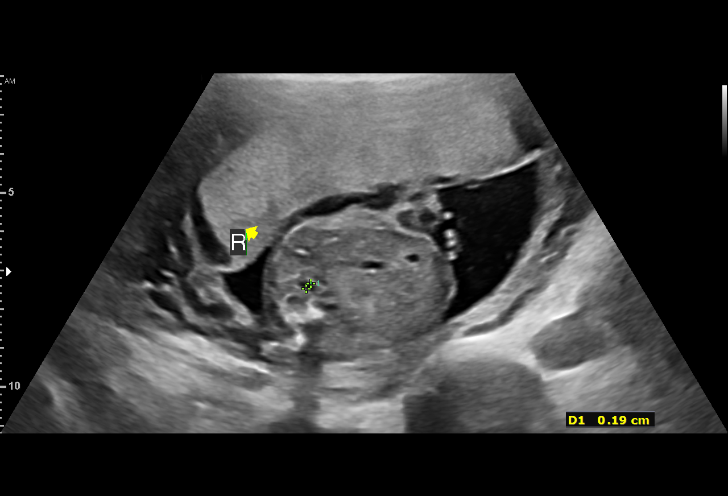
[im 111/137]
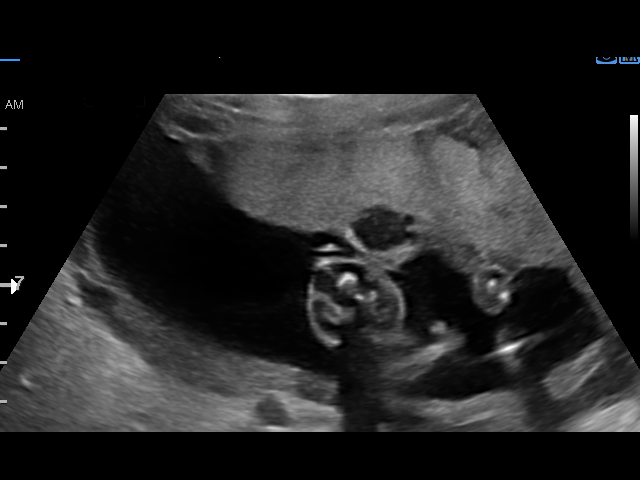
[im 121/137]
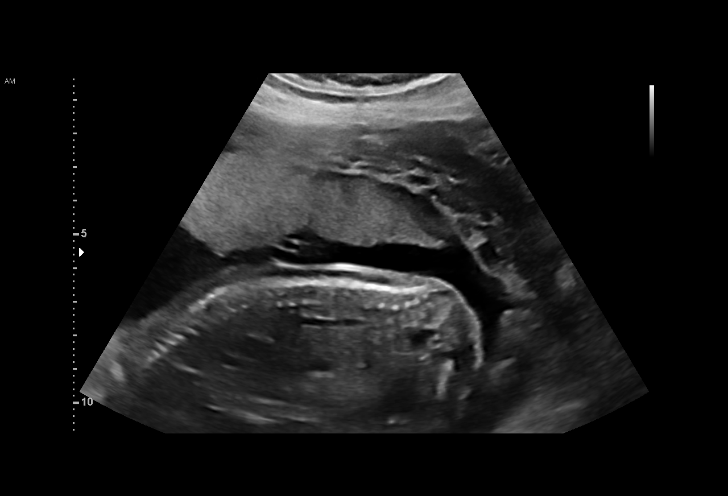
[im 131/137]
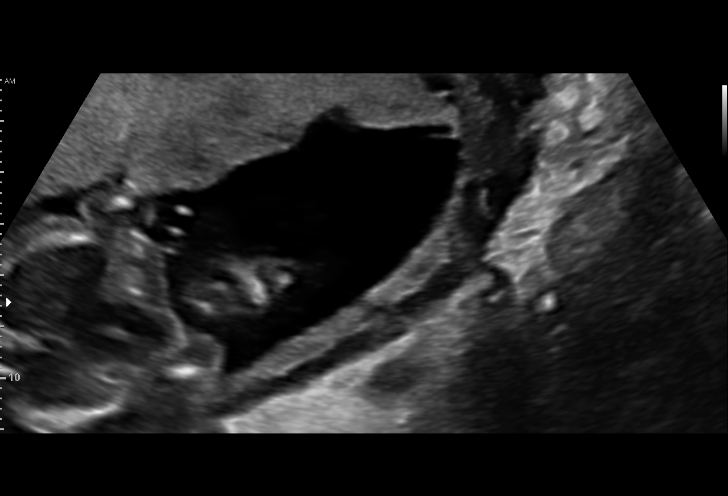

[13 of 28 positions shown; findings below may reference images not displayed]

Indications

 Advanced maternal age multigravida 35+,
 second trimester
 Maternal care for (suspected) hereditary
 disease in fetus, not applicable or
 unspecified (maternal [place] Syndrome)
 Marginal insertion of umbilical cord affecting
 management of mother in second trimester
 19 weeks gestation of pregnancy
 Antenatal screening for malformations
 LR NIPS
Fetal Evaluation

 Num Of Fetuses:         1
 Fetal Heart Rate(bpm):  144
 Cardiac Activity:       Observed
 Presentation:           Breech
 Placenta:               Anterior
 P. Cord Insertion:      Marginal insertion

 Amniotic Fluid
 AFI FV:      Within normal limits
Biometry

 BPD:      43.5  mm     G. Age:  19w 1d         38  %    CI:        71.66   %    70 - 86
                                                         FL/HC:      18.6   %    16.1 -
 HC:      163.6  mm     G. Age:  19w 1d         27  %    HC/AC:      1.15        1.09 -
 AC:      142.2  mm     G. Age:  19w 4d         50  %    FL/BPD:     70.1   %
 FL:       30.5  mm     G. Age:  19w 3d         43  %    FL/AC:      21.4   %    20 - 24
 CER:      19.8  mm     G. Age:  19w 1d         44  %
 NFT:       5.2  mm
 LV:          7  mm
 CM:        3.6  mm

 Est. FW:     294  gm    0 lb 10 oz      47  %
OB History

 Gravidity:    3         Term:   0        Prem:   0        SAB:   2
 TOP:          0       Ectopic:  0        Living: 0
Gestational Age

 LMP:           19w 3d        Date:  07/07/21                 EDD:   04/13/22
 U/S Today:     19w 2d                                        EDD:   04/14/22
 Best:          19w 3d     Det. By:  LMP  (07/07/21)          EDD:   04/13/22
Anatomy

 Cranium:               Appears normal         Aortic Arch:            Appears normal
 Cavum:                 Appears normal         Ductal Arch:            Appears normal
 Ventricles:            Appears normal         Diaphragm:              Appears normal
 Choroid Plexus:        Appears normal         Stomach:                Appears normal, left
                                                                       sided
 Cerebellum:            Appears normal         Abdomen:                Appears normal
 Posterior Fossa:       Appears normal         Abdominal Wall:         Appears nml (cord
                                                                       insert, abd wall)
 Nuchal Fold:           Appears normal         Cord Vessels:           Appears normal (3
                                                                       vessel cord)
 Face:                  Appears normal         Kidneys:                Appear normal
                        (orbits and profile)
 Lips:                  Appears normal         Bladder:                Appears normal
 Thoracic:              Appears normal         Spine:                  Appears normal
 Heart:                 Appears normal         Upper Extremities:      Appears normal
                        (4CH, axis, and
                        situs)
 RVOT:                  Appears normal         Lower Extremities:      Appears normal
 LVOT:                  Appears normal

 Other:  3VV and 3VTV visualized. Nasal bone, lenses, maxilla, mandible and
         falx visualized. Heels/feet and open hands/5th digits visualized. Fetus
         appears to be female.
Cervix Uterus Adnexa

 Cervix
 Length:           3.13  cm.
 Normal appearance by transabdominal scan.

 Uterus
 No abnormality visualized.

 Right Ovary
 Not visualized.

 Left Ovary
 Within normal limits.
Impression

 G3 [address].
 Advanced maternal age. On cell-free fetal DNA screening,
 the risks of fetal aneuploidies are not increased .

 Patient has [place] Syndrome detected at birth. She had 4
 reconstructive surgeris in her right breast and surgery in her
 right hand.

 Obstetric history significant for 2 early miscarriages.  Patient
 had D&C following her first miscarriage.
 Allergies: Sulfa drugs (flushing/redness of her body)

 We performed fetal anatomy scan. No makers of
 aneuploidies or fetal structural defects are seen. Fetal
 biometry is consistent with her previously-established dates.
 Amniotic fluid is normal and good fetal activity is seen.
 Marginal cord insertion is seen. I explained the finding that
 marginal cord insertion is not usually associated with fetal
 adverse outcomes. However, in some cases, it can be
 associated with fetal growth restriction. We recommend serial
 fetal growth assessments till delivery.  Patient understands
 the limitations of ultrasound in detecting fetal anomalies.

 I reassured the patient that her genetic condition of [place]
 syndrome is not associated with any adverse fetal or
 pregnancy outcomes.  Inheritance of this condition is roughly
 4.2%.  The couple met with our genetic counselor after
 ultrasound.  You will be receiving a separate letter from.
 I do not expect difficulty in breastfeeding with normal left
 breast. Lactation consultation will be requested.
Recommendations

 -An appointment was made for her to return in 9 weeks for
 fetal growth assessment.
                 Nielson, [place]

## 2024-01-05 ENCOUNTER — Other Ambulatory Visit: Payer: Self-pay

## 2024-01-05 ENCOUNTER — Encounter: Payer: Self-pay | Admitting: Oncology

## 2024-01-05 ENCOUNTER — Ambulatory Visit (INDEPENDENT_AMBULATORY_CARE_PROVIDER_SITE_OTHER): Payer: Self-pay | Admitting: Oncology

## 2024-01-05 VITALS — BP 96/64 | HR 78 | Temp 97.9°F | Ht 64.0 in

## 2024-01-05 DIAGNOSIS — J029 Acute pharyngitis, unspecified: Secondary | ICD-10-CM

## 2024-01-05 LAB — POCT RAPID STREP A (OFFICE): Rapid Strep A Screen: NEGATIVE

## 2024-01-05 MED ORDER — MONTELUKAST SODIUM 10 MG PO TABS: 10.0000 mg | ORAL_TABLET | Freq: Every day | ORAL | 3 refills | Status: AC

## 2024-01-05 MED ORDER — AZITHROMYCIN 250 MG PO TABS: ORAL_TABLET | ORAL | 0 refills | Status: AC

## 2024-01-05 NOTE — Progress Notes (Signed)
 [facility]  [address] [place], [place] [address] Phone: [phone] Fax: [phone]   Office Visit Note  Patient Name: Deborah Stafford  Date of [id number]  Med Rec number [id number]  Date of Service: 01/05/2024  Bee pollen and Sulfa antibiotics  Chief Complaint  Patient presents with   Sore Throat    Sore throat x 1 week. She states she feels like her tonsils are swollen and she noticed white spots on them over the weekend.    HPI Patient is an 38 y.o. here for acute visit.  Reports sore throat and white patches on her tongue over the past 2 weeks.  Reports she has history of seasonal allergies and routinely takes Zyrtec/Claritin along with Flonase.  Postnasal drip is likely contributing but feels like it is progressively getting worse.  She has a 56-year-old daughter who has had a cold over the past week or so.  No recent antibiotics.  No known drug allergies.  Current Medication:  Outpatient Encounter Medications as of 01/05/2024  Medication Sig   acetaminophen  (TYLENOL ) 325 MG tablet Take 650 mg by mouth every 6 (six) hours as needed.   amphetamine -dextroamphetamine  (ADDERALL) 10 MG tablet Take 20 mg by mouth daily with breakfast.   azithromycin (ZITHROMAX) 250 MG tablet Take 2 tablets on day 1, then 1 tablet daily on days 2 through 5   ibuprofen  (ADVIL ) 600 MG tablet Take 1 tablet (600 mg total) by mouth every 6 (six) hours. (Patient taking differently: Take 600 mg by mouth every 6 (six) hours as needed.)   loratadine (CLARITIN) 10 MG tablet Take 10 mg by mouth daily as needed for allergies.   montelukast (SINGULAIR) 10 MG tablet Take 1 tablet (10 mg total) by mouth at bedtime.   Multiple Vitamin (MULTIVITAMIN) capsule Take 1 capsule by mouth daily.   [DISCONTINUED] albuterol  (VENTOLIN  HFA) 108 (90 Base) MCG/ACT inhaler Inhale 2 puffs into the lungs every 6 (six) hours as needed for wheezing or shortness of breath.   [DISCONTINUED] benzonatate   (TESSALON ) 200 MG capsule Take 1 capsule (200 mg total) by mouth 3 (three) times daily as needed for cough.   [DISCONTINUED] methylPREDNISolone  (MEDROL  DOSEPAK) 4 MG TBPK tablet Take as directed   No facility-administered encounter medications on file as of 01/05/2024.      Medical History: Past Medical History:  Diagnosis Date   Irregular heartbeat    Varicose veins of leg with edema, right      Vital Signs: BP 96/64   Pulse 78   Temp 97.9 F (36.6 C)   Ht 5\' 4"  (1.626 m)   SpO2 99%   BMI 25.23 kg/m   ROS: As per HPI.  All other pertinent ROS negative.     Review of Systems  Constitutional:  Negative for fatigue.  HENT:  Positive for congestion, sinus pressure and sore throat. Negative for ear pain and sinus pain.   Respiratory:  Negative for cough.   Gastrointestinal:  Negative for diarrhea, nausea and vomiting.  Musculoskeletal:  Negative for myalgias.  Neurological:  Positive for headaches. Negative for dizziness.    Physical Exam Vitals reviewed.  HENT:     Right Ear: A middle ear effusion is present.     Left Ear: A middle ear effusion is present.     Nose: Mucosal edema, congestion and rhinorrhea present. Rhinorrhea is clear.     Right Turbinates: Not swollen.     Left Turbinates: Not swollen.     Right  Sinus: Maxillary sinus tenderness and frontal sinus tenderness present.     Left Sinus: Maxillary sinus tenderness and frontal sinus tenderness present.     Mouth/Throat:     Mouth: Mucous membranes are moist.     Pharynx: Oropharynx is clear. Posterior oropharyngeal erythema and postnasal drip present.     Tonsils: 0 on the right. 0 on the left.  Cardiovascular:     Rate and Rhythm: Normal rate and regular rhythm.  Pulmonary:     Effort: Pulmonary effort is normal.     Breath sounds: Normal breath sounds.  Neurological:     Mental Status: She is alert.     Results for orders placed or performed in visit on 01/05/24 (from the past 24 hours)  POCT  rapid strep A     Status: None   Collection Time: 01/05/24  8:00 AM  Result Value Ref Range   Rapid Strep A Screen Negative Negative    Assessment/Plan: 1. Sore throat (Primary) Strep A negative.  Reports history of sinus infections in the past and she feels like 1 may be developing.  Symptoms have been present for greater than 2 weeks. We discussed continuing OTC antihistamines with Claritin and Allegra and may add in Benadryl  at bedtime.  Does not like to use Flonase.  Given symptoms appear to be progressively worsening would recommend treatment with azithromycin.  Take 2 tabs on day 1 and 1 tablet daily until complete.  This is a total of 5 days.  In the past, she has benefited from Singulair.  Will call in the prescription in for her which she may take for seasonal allergies along with Allegra/Claritin as needed.  Keep me updated on your symptoms  - loratadine (CLARITIN) 10 MG tablet; Take 10 mg by mouth daily as needed for allergies. - POCT rapid strep A - azithromycin (ZITHROMAX) 250 MG tablet; Take 2 tablets on day 1, then 1 tablet daily on days 2 through 5  Dispense: 6 tablet; Refill: 0 - montelukast (SINGULAIR) 10 MG tablet; Take 1 tablet (10 mg total) by mouth at bedtime.  Dispense: 30 tablet; Refill: 3   General Counseling: Gwendlyon verbalizes understanding of the findings of todays visit and agrees with plan of treatment. I have discussed any further diagnostic evaluation that may be needed or ordered today. We also reviewed her medications today. she has been encouraged to call the office with any questions or concerns that should arise related to todays visit.   Orders Placed This Encounter  Procedures   POCT rapid strep A    Meds ordered this encounter  Medications   azithromycin (ZITHROMAX) 250 MG tablet    Sig: Take 2 tablets on day 1, then 1 tablet daily on days 2 through 5    Dispense:  6 tablet    Refill:  0   montelukast (SINGULAIR) 10 MG tablet    Sig: Take 1  tablet (10 mg total) by mouth at bedtime.    Dispense:  30 tablet    Refill:  3    I spent 20 minutes dedicated to the care of this patient (face-to-face and non-face-to-face) on the date of the encounter to include what is described in the assessment and plan.   Charlton Cooler, NP 01/05/2024 3:00 PM
